# Patient Record
Sex: Female | Born: 1975 | Race: Black or African American | Hispanic: No | Marital: Single | Smoking: Current some day smoker
Health system: Southern US, Community
[De-identification: ages and names within clinical notes are randomized; demographics above are authoritative.]

## PROBLEM LIST (undated history)

## (undated) DIAGNOSIS — F209 Schizophrenia, unspecified: Secondary | ICD-10-CM

## (undated) DIAGNOSIS — D649 Anemia, unspecified: Secondary | ICD-10-CM

## (undated) DIAGNOSIS — F319 Bipolar disorder, unspecified: Secondary | ICD-10-CM

## (undated) DIAGNOSIS — I10 Essential (primary) hypertension: Secondary | ICD-10-CM

---

## 2006-12-08 ENCOUNTER — Ambulatory Visit: Payer: Self-pay | Admitting: Family Medicine

## 2007-03-01 ENCOUNTER — Observation Stay: Payer: Self-pay | Admitting: Unknown Physician Specialty

## 2007-05-25 ENCOUNTER — Observation Stay: Payer: Self-pay

## 2007-05-26 ENCOUNTER — Observation Stay: Payer: Self-pay

## 2007-06-08 ENCOUNTER — Inpatient Hospital Stay: Payer: Self-pay | Admitting: Unknown Physician Specialty

## 2007-10-25 ENCOUNTER — Emergency Department: Payer: Self-pay | Admitting: Emergency Medicine

## 2007-11-24 ENCOUNTER — Encounter: Payer: Self-pay | Admitting: Oral Surgery

## 2007-12-17 ENCOUNTER — Encounter: Payer: Self-pay | Admitting: Oral Surgery

## 2008-03-08 IMAGING — US US OB US >=[ID] SNGL FETUS
1 series · 17 of 28 positions shown · non-contrast
Comparison: none

REASON FOR EXAM: vaginal bleeding, back pain
COMMENTS:

[Series 1: us ob us >=(id) sngl fetus · 17 of 60 slices shown]
[im 1/60]
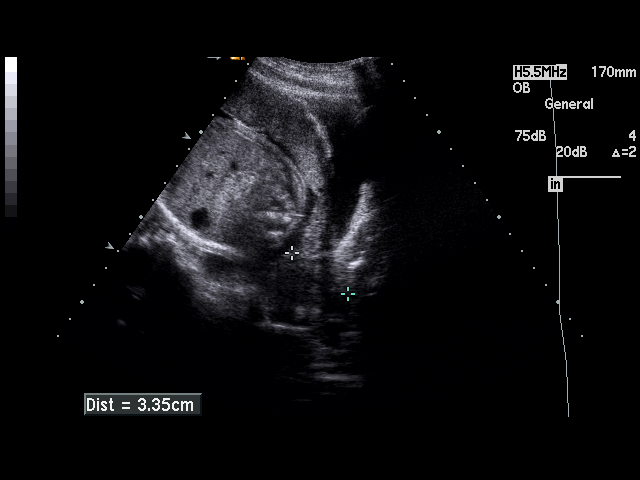
[im 5/60]
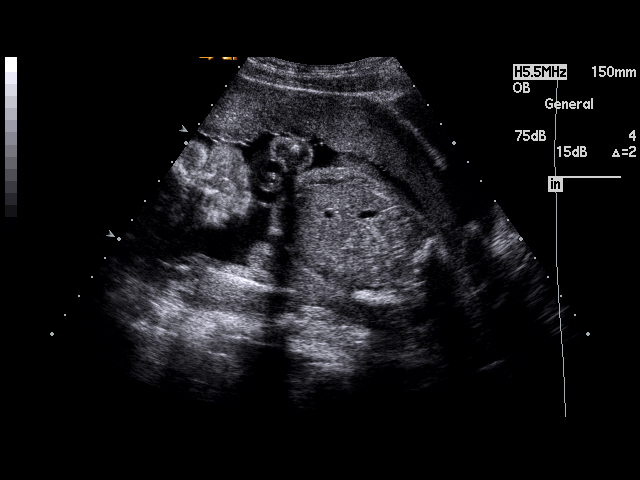
[im 9/60]
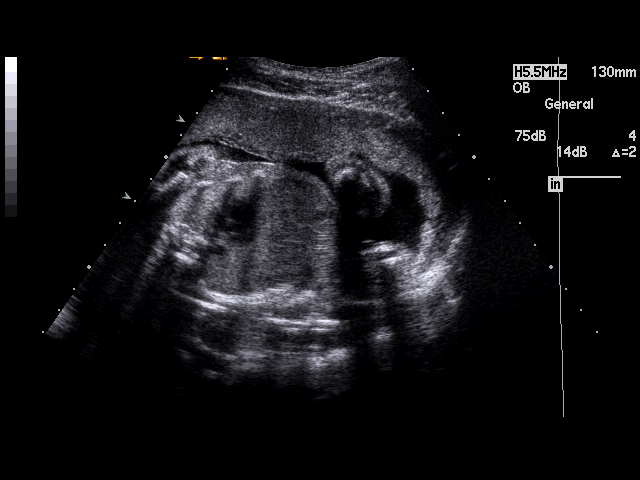
[im 11/60]
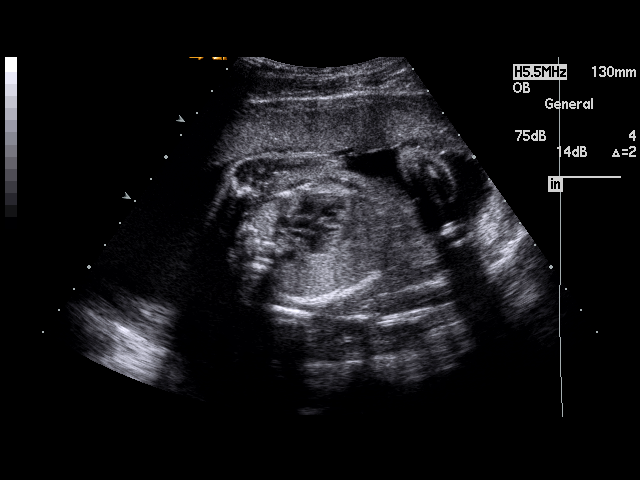
[im 16/60]
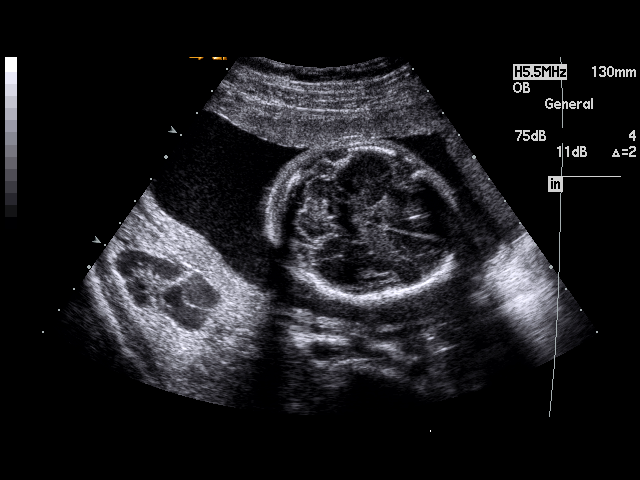
[im 20/60]
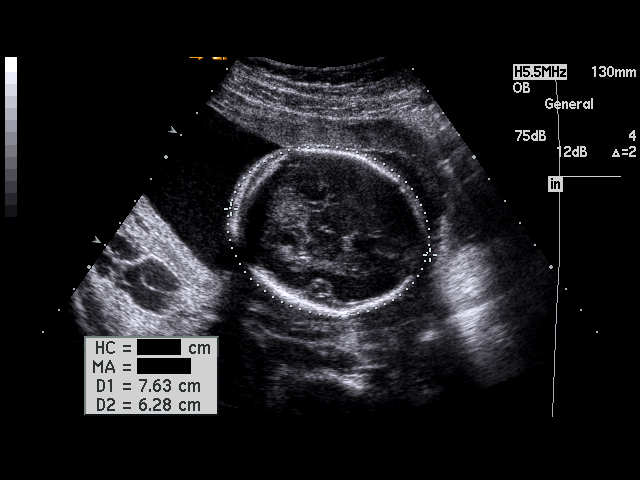
[im 22/60]
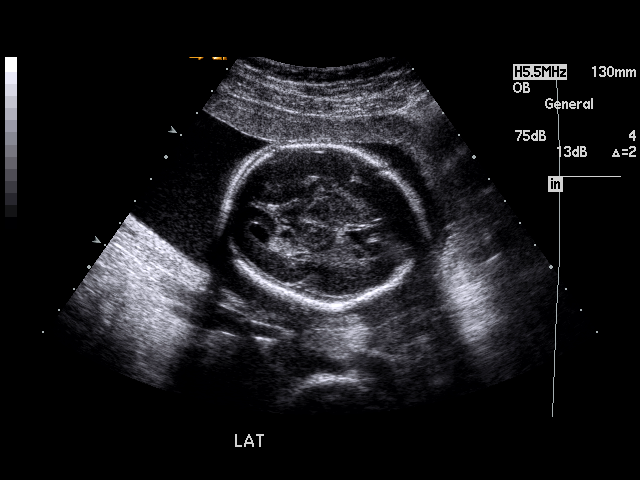
[im 27/60]
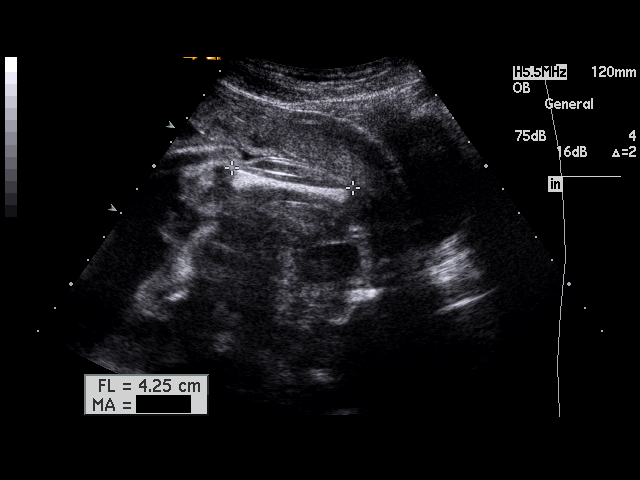
[im 31/60]
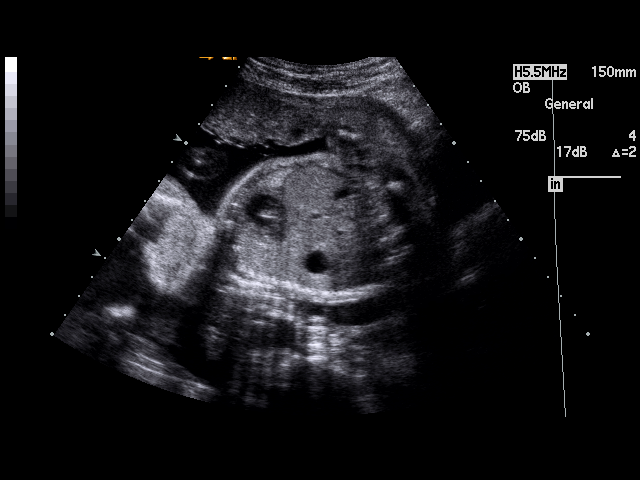
[im 33/60]
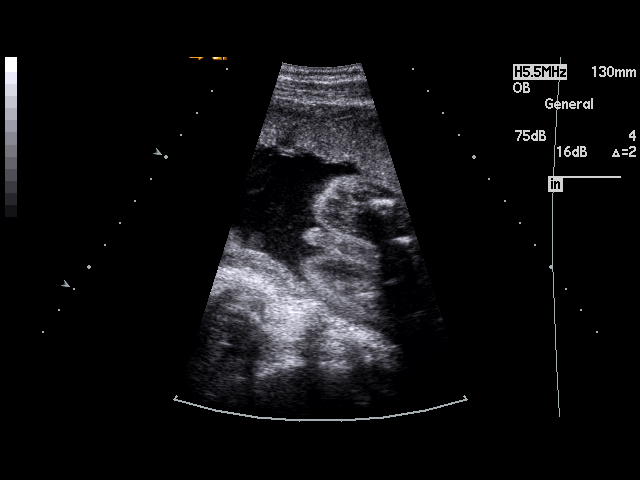
[im 38/60]
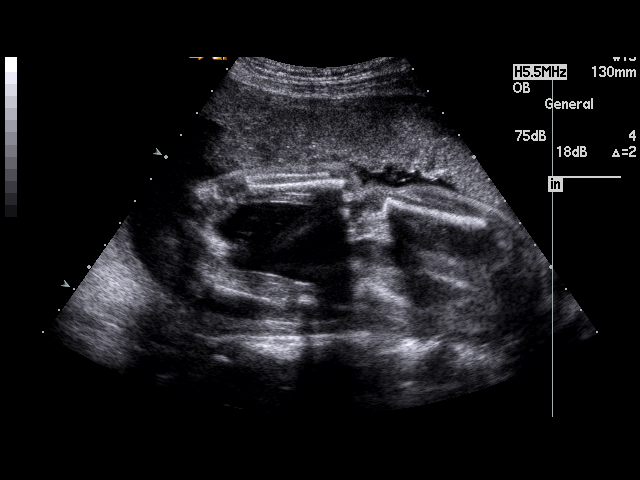
[im 40/60]
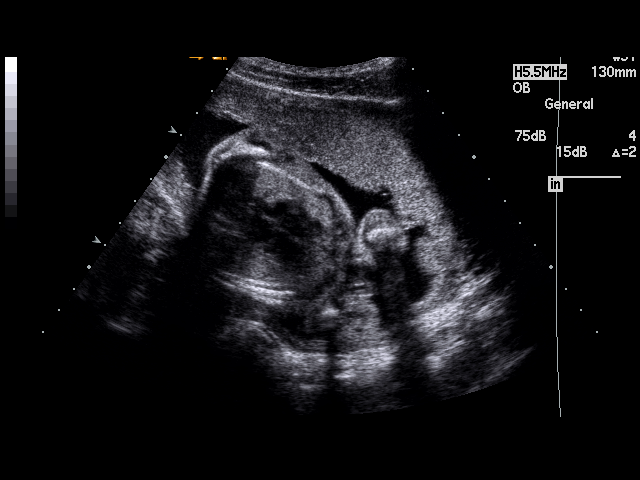
[im 44/60]
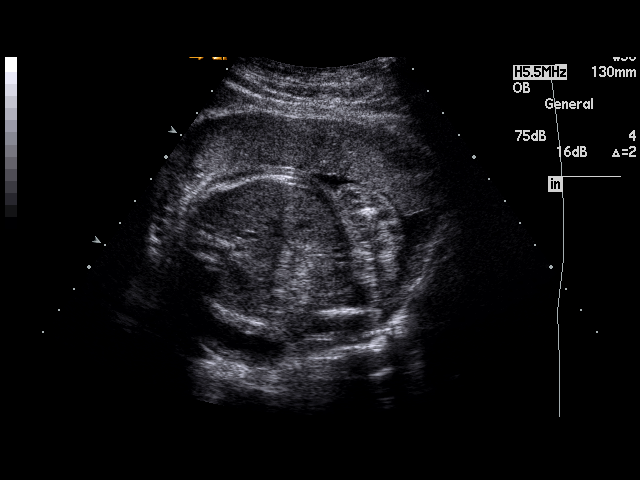
[im 49/60]
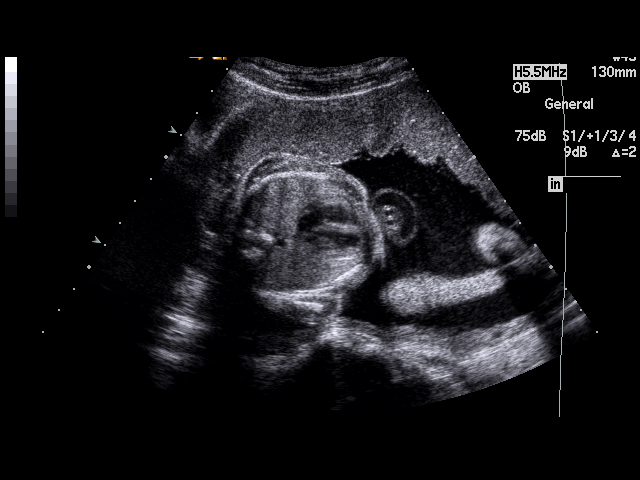
[im 51/60]
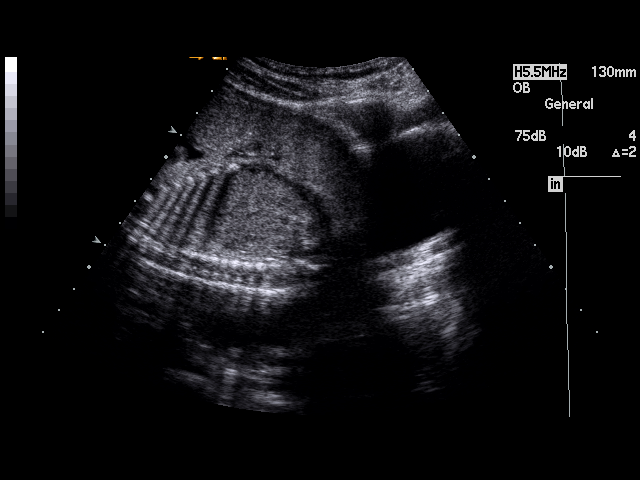
[im 55/60]
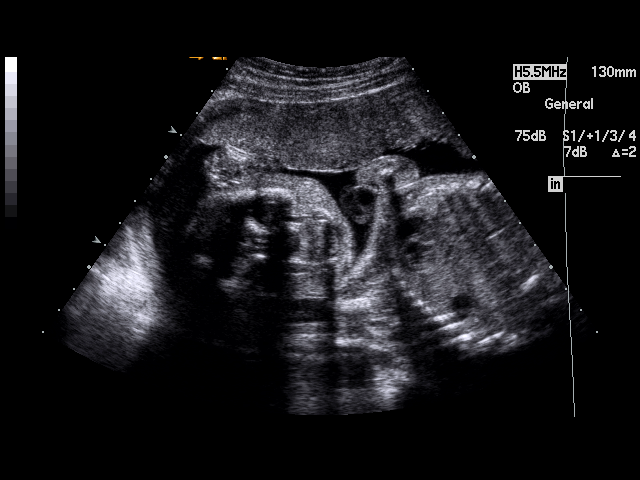
[im 60/60]
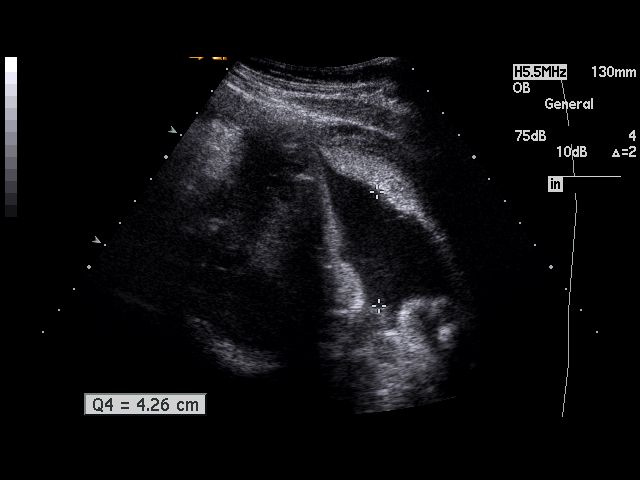

[17 of 28 positions shown; findings below may reference images not displayed]

PROCEDURE:     US  - US OB GREATER/OR EQUAL TO EV1OY  - March 01, 2007  [DATE]

RESULT:     There is a living intrauterine gestation.  Presentation
currently is breech. The placenta is anterior and terminates approximately
3.4 cm superior to the cervix. Fetal cardiac activity was monitored at 138
beats per minute. The fetal heart, stomach and urinary bladder are
visualized. No hydronephrosis or hydrocephalus is seen. Amniotic fluid
volume appears normal. Fetal measurements are as follows:

BPD: 5.84 cm (23 weeks-9 days)
HC: 21.97 cm (24 weeks-7 days)
AC: 18.6 cm (23 weeks-5 days)
FL: 4.32 cm (24 weeks-3 day)
EFW = 646 grams.  Average ultrasound age is 23 weeks-0 days.  Ultrasound EDD
is June 23, 2007.  There has been appropriate interval growth since the
prior exam of December 08, 2006.
IMPRESSION: 1)Please see above.

## 2010-10-28 ENCOUNTER — Emergency Department: Payer: Self-pay | Admitting: Emergency Medicine

## 2010-11-19 ENCOUNTER — Encounter: Payer: Self-pay | Admitting: Maternal & Fetal Medicine

## 2011-02-05 ENCOUNTER — Observation Stay: Payer: Self-pay | Admitting: Obstetrics & Gynecology

## 2011-04-01 ENCOUNTER — Encounter: Payer: Self-pay | Admitting: Obstetrics and Gynecology

## 2011-04-08 ENCOUNTER — Encounter: Payer: Self-pay | Admitting: Maternal & Fetal Medicine

## 2011-04-08 ENCOUNTER — Inpatient Hospital Stay: Payer: Self-pay

## 2011-04-15 ENCOUNTER — Encounter: Payer: Self-pay | Admitting: Maternal and Fetal Medicine

## 2011-04-15 ENCOUNTER — Observation Stay: Payer: Self-pay | Admitting: Advanced Practice Midwife

## 2011-04-19 ENCOUNTER — Encounter: Payer: Self-pay | Admitting: Obstetrics and Gynecology

## 2011-04-22 ENCOUNTER — Encounter: Payer: Self-pay | Admitting: Maternal & Fetal Medicine

## 2011-04-29 ENCOUNTER — Encounter: Payer: Self-pay | Admitting: Obstetrics and Gynecology

## 2011-04-29 ENCOUNTER — Observation Stay: Payer: Self-pay

## 2011-05-02 ENCOUNTER — Inpatient Hospital Stay: Payer: Self-pay

## 2011-05-04 LAB — PATHOLOGY REPORT

## 2011-11-05 IMAGING — US US OB < 14 WEEKS
1 series · 17 of 28 positions shown · non-contrast
Comparison: none

REASON FOR EXAM: cramping and bleeding
COMMENTS:

[Series 1: us ob < 14 weeks · 17 of 48 slices shown]
[im 1/48]
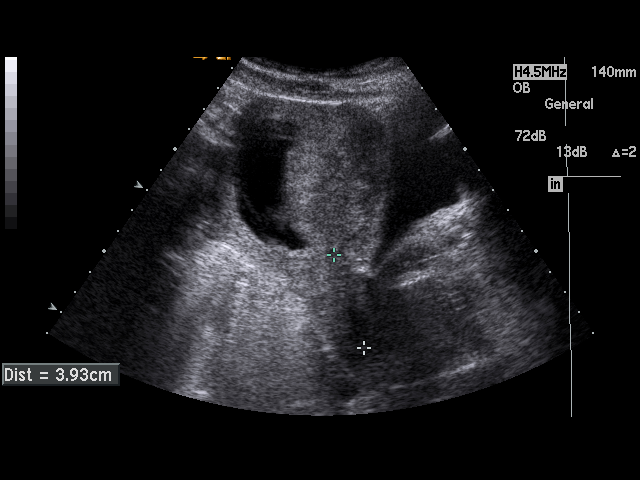
[im 4/48]
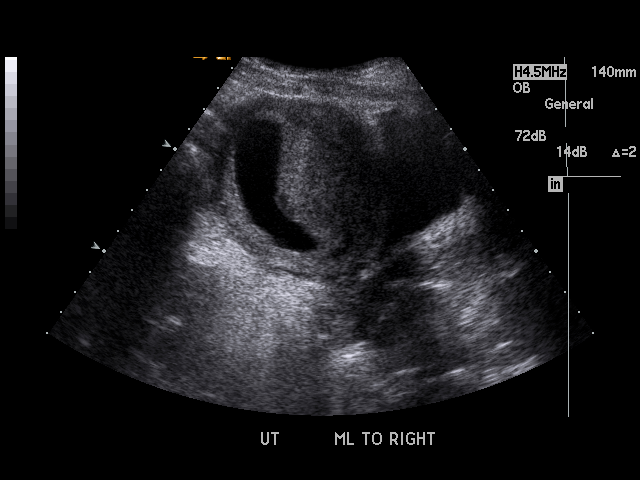
[im 7/48]
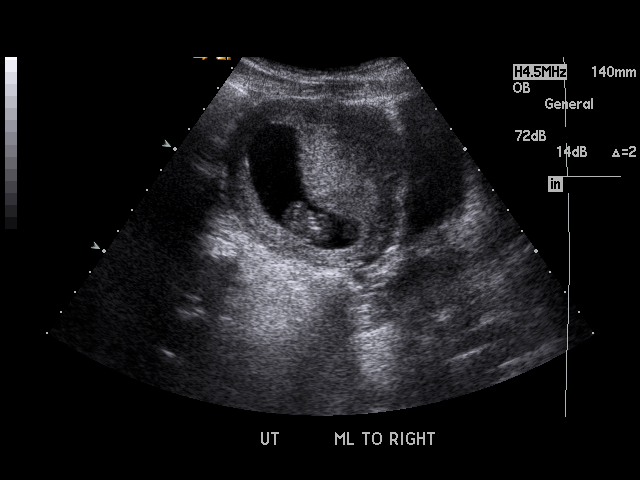
[im 9/48]
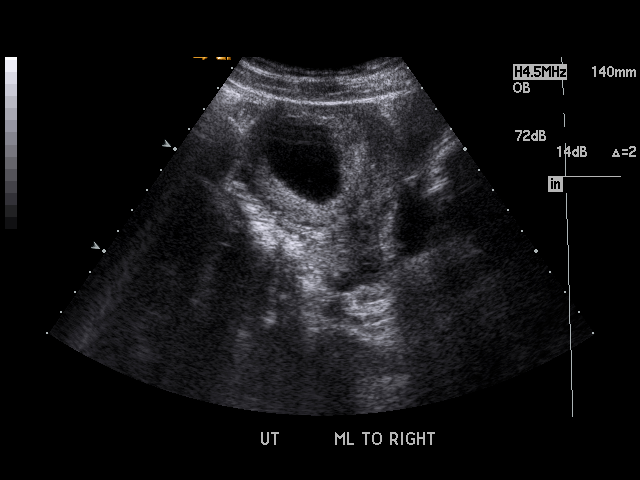
[im 13/48]
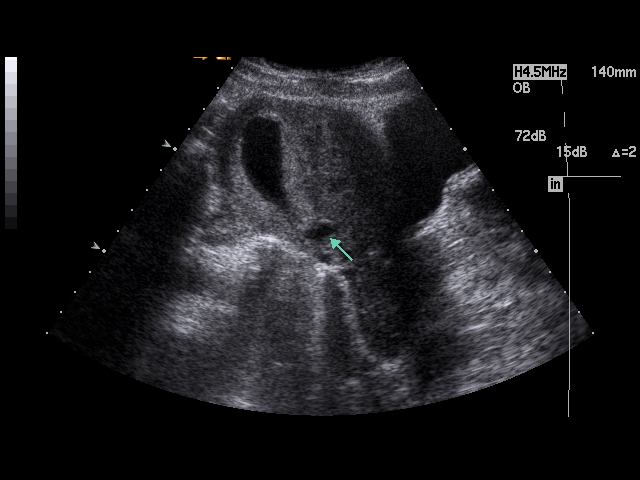
[im 16/48]
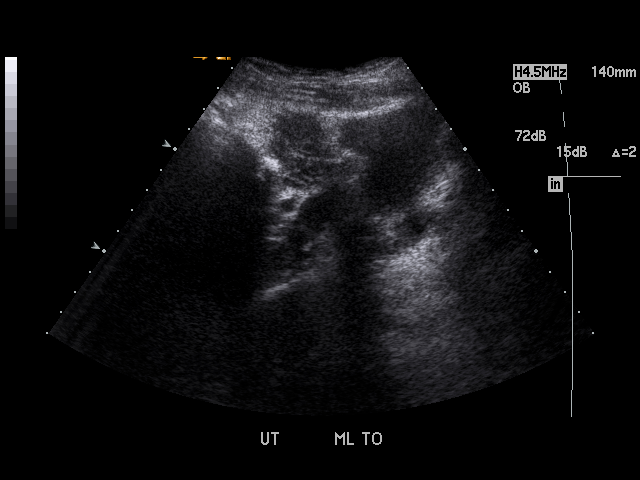
[im 18/48]
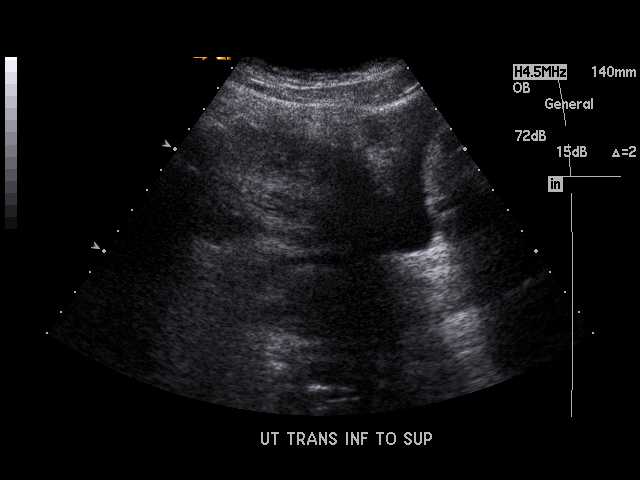
[im 21/48]
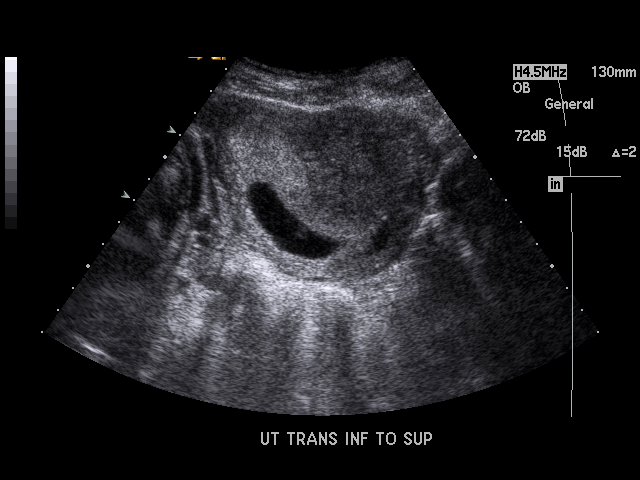
[im 25/48]
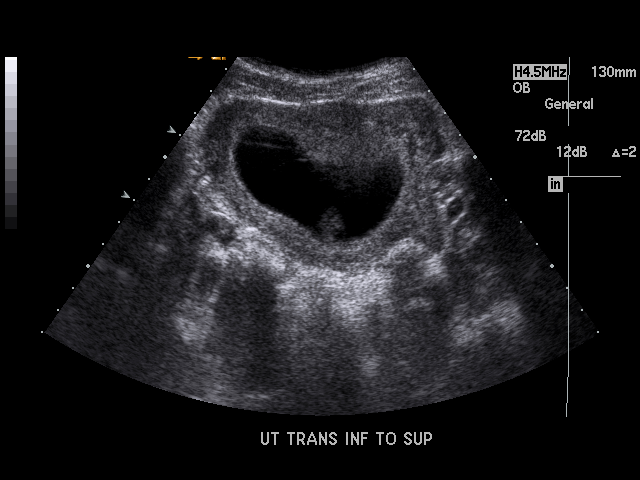
[im 27/48]
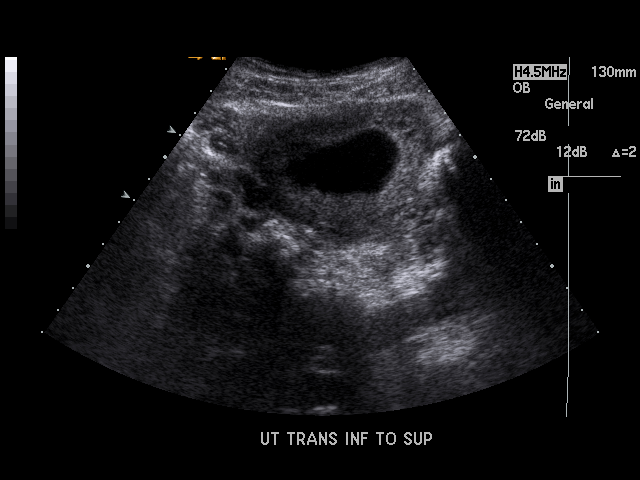
[im 30/48]
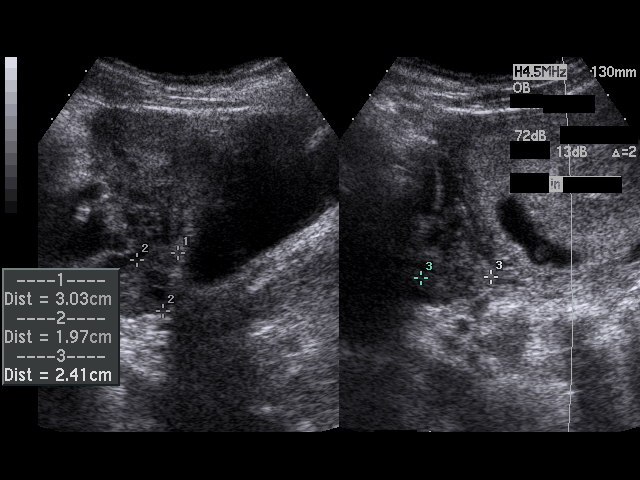
[im 32/48]
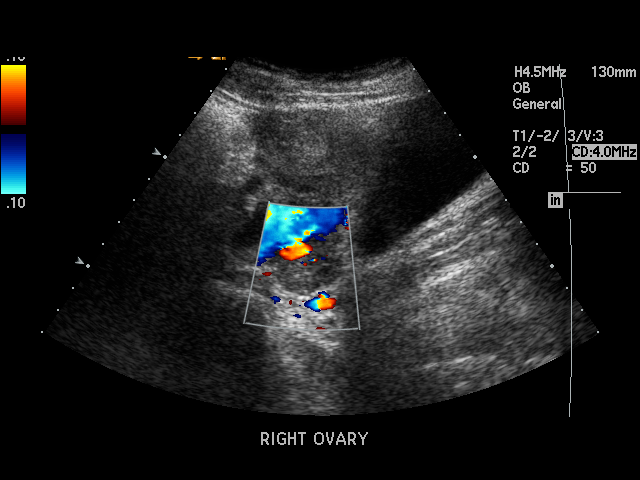
[im 35/48]
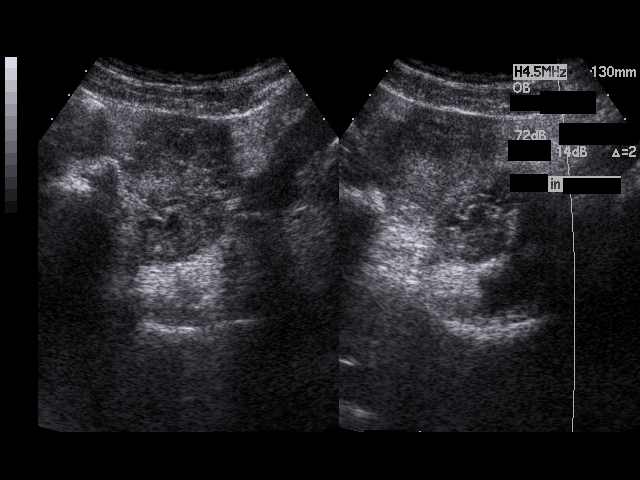
[im 39/48]
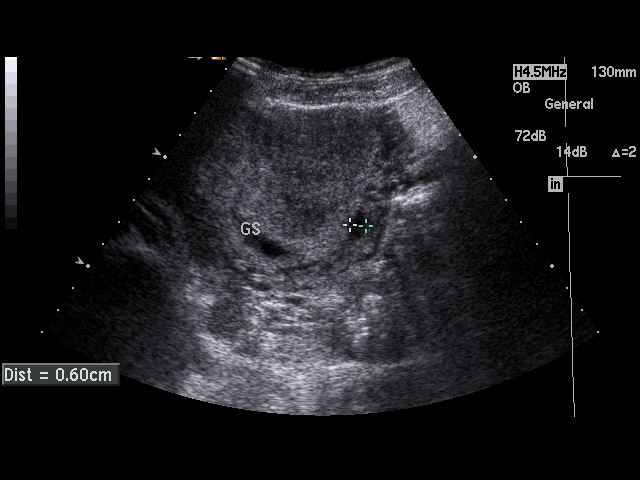
[im 41/48]
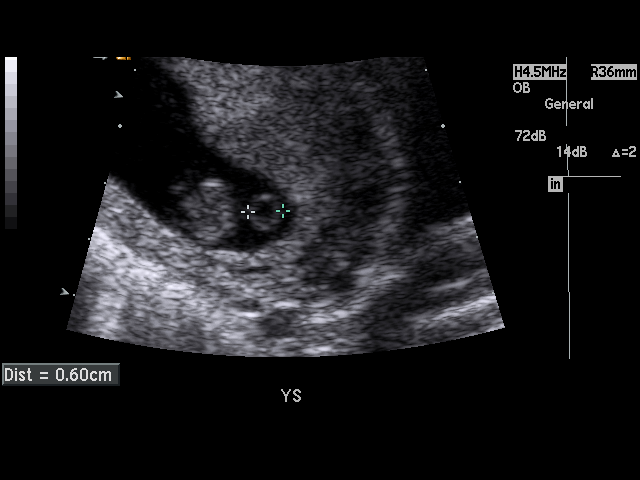
[im 44/48]
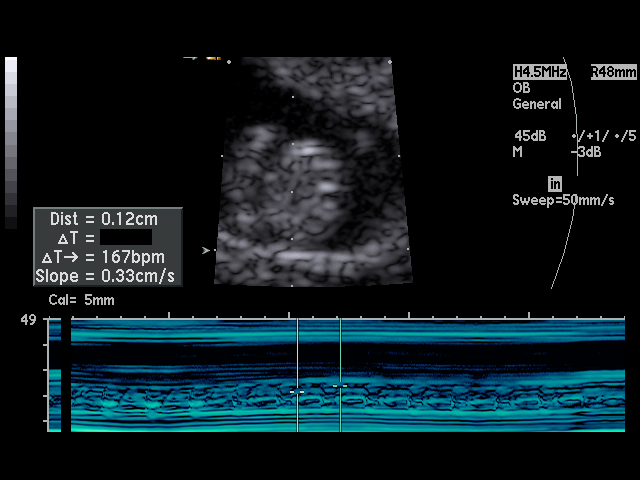
[im 48/48]
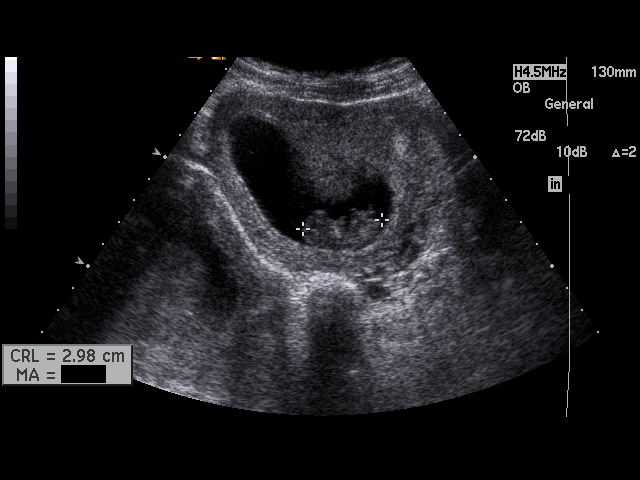

[17 of 28 positions shown; findings below may reference images not displayed]

PROCEDURE:     US  - US OB LESS THAN 14 WEEKS  - October 28, 2010  [DATE]

RESULT:     There is a gravid uterus present. The crown-rump length measures
30.1 mm corresponding to a 9 week 6 day gestation. There is a yolk sac
visible. Fetal cardiac activity with a rate of 167 beats per minute is
demonstrated. A tiny subchorionic bleed to the left of midline inferiorly is
seen. It measures 1.6 x 0.6 cm.

The maternal ovaries are normal in echotexture. The right ovary measures 3 x
2 x 2.4 cm. The left ovary measures 3.2 x 2 x 2.5 cm.
IMPRESSION: There is a viable IUP with estimated gestational age of 9
weeks 6 days. The estimated date of confinement is 27 May, 2011. There is a
tiny subchorionic hemorrhage. No free fluid is demonstrated in the
cul-de-sac or adnexal regions.

## 2013-07-30 ENCOUNTER — Emergency Department: Payer: Self-pay | Admitting: Emergency Medicine

## 2014-04-25 ENCOUNTER — Observation Stay: Payer: Self-pay | Admitting: Obstetrics and Gynecology

## 2014-04-25 LAB — COMPREHENSIVE METABOLIC PANEL
AST: 22 U/L (ref 15–37)
Albumin: 3.9 g/dL (ref 3.4–5.0)
Alkaline Phosphatase: 60 U/L
Anion Gap: 4 — ABNORMAL LOW (ref 7–16)
BUN: 9 mg/dL (ref 7–18)
Bilirubin,Total: 0.2 mg/dL (ref 0.2–1.0)
CO2: 24 mmol/L (ref 21–32)
Calcium, Total: 8.6 mg/dL (ref 8.5–10.1)
Chloride: 108 mmol/L — ABNORMAL HIGH (ref 98–107)
Creatinine: 1.13 mg/dL (ref 0.60–1.30)
Glucose: 88 mg/dL (ref 65–99)
OSMOLALITY: 270 (ref 275–301)
Potassium: 3.6 mmol/L (ref 3.5–5.1)
SGPT (ALT): 14 U/L (ref 12–78)
Sodium: 136 mmol/L (ref 136–145)
Total Protein: 7.7 g/dL (ref 6.4–8.2)

## 2014-04-25 LAB — CBC
HCT: 23.2 % — ABNORMAL LOW (ref 35.0–47.0)
HGB: 6.6 g/dL — ABNORMAL LOW (ref 12.0–16.0)
MCH: 18 pg — ABNORMAL LOW (ref 26.0–34.0)
MCHC: 28.5 g/dL — ABNORMAL LOW (ref 32.0–36.0)
MCV: 63 fL — ABNORMAL LOW (ref 80–100)
Platelet: 334 10*3/uL (ref 150–440)
RBC: 3.67 10*6/uL — ABNORMAL LOW (ref 3.80–5.20)
RDW: 18.5 % — ABNORMAL HIGH (ref 11.5–14.5)
WBC: 4.9 10*3/uL (ref 3.6–11.0)

## 2014-04-25 LAB — APTT: Activated PTT: 33 secs (ref 23.6–35.9)

## 2014-04-25 LAB — PROTIME-INR
INR: 1
Prothrombin Time: 13 secs (ref 11.5–14.7)

## 2014-04-25 LAB — HCG, QUANTITATIVE, PREGNANCY

## 2014-04-25 LAB — WET PREP, GENITAL

## 2014-04-25 LAB — TSH: THYROID STIMULATING HORM: 0.977 u[IU]/mL

## 2014-04-25 LAB — GC/CHLAMYDIA PROBE AMP

## 2014-04-25 LAB — LIPASE, BLOOD: Lipase: 140 U/L (ref 73–393)

## 2014-04-26 LAB — CBC WITH DIFFERENTIAL/PLATELET
Basophil #: 0 10*3/uL (ref 0.0–0.1)
Basophil %: 0.7 %
Eosinophil #: 0.3 10*3/uL (ref 0.0–0.7)
Eosinophil %: 4.9 %
HCT: 24.1 % — ABNORMAL LOW (ref 35.0–47.0)
HGB: 7.2 g/dL — ABNORMAL LOW (ref 12.0–16.0)
LYMPHS ABS: 2.6 10*3/uL (ref 1.0–3.6)
Lymphocyte %: 40.3 %
MCH: 19.8 pg — ABNORMAL LOW (ref 26.0–34.0)
MCHC: 29.7 g/dL — AB (ref 32.0–36.0)
MCV: 67 fL — AB (ref 80–100)
Monocyte #: 0.6 x10 3/mm (ref 0.2–0.9)
Monocyte %: 8.8 %
NEUTROS PCT: 45.3 %
Neutrophil #: 2.9 10*3/uL (ref 1.4–6.5)
Platelet: 293 10*3/uL (ref 150–440)
RBC: 3.61 10*6/uL — AB (ref 3.80–5.20)
RDW: 21.5 % — ABNORMAL HIGH (ref 11.5–14.5)
WBC: 6.4 10*3/uL (ref 3.6–11.0)

## 2015-03-08 NOTE — Consult Note (Signed)
Brief Consult Note: Diagnosis: menorrhagia to anemia.   Recommend further assessment or treatment.   Orders entered.   Discussed with Attending MD.  Electronic Signatures: Lorrene ReidStaebler, Deryl Giroux M (MD)  (Signed 11-Jun-15 17:28)  Authored: Brief Consult Note   Last Updated: 11-Jun-15 17:28 by Lorrene ReidStaebler, Dandra Shambaugh M (MD)

## 2015-08-02 ENCOUNTER — Encounter: Payer: Self-pay | Admitting: Emergency Medicine

## 2015-08-02 ENCOUNTER — Emergency Department
Admission: EM | Admit: 2015-08-02 | Discharge: 2015-08-04 | Disposition: A | Discharge status: Other Acute Inpatient Hospital | Payer: Medicaid Other | Attending: Emergency Medicine | Admitting: Emergency Medicine

## 2015-08-02 DIAGNOSIS — Z3202 Encounter for pregnancy test, result negative: Secondary | ICD-10-CM | POA: Diagnosis not present

## 2015-08-02 DIAGNOSIS — I10 Essential (primary) hypertension: Secondary | ICD-10-CM | POA: Diagnosis not present

## 2015-08-02 DIAGNOSIS — F29 Unspecified psychosis not due to a substance or known physiological condition: Secondary | ICD-10-CM | POA: Diagnosis not present

## 2015-08-02 DIAGNOSIS — Z720 Tobacco use: Secondary | ICD-10-CM | POA: Diagnosis not present

## 2015-08-02 DIAGNOSIS — Z008 Encounter for other general examination: Secondary | ICD-10-CM | POA: Diagnosis present

## 2015-08-02 DIAGNOSIS — R440 Auditory hallucinations: Secondary | ICD-10-CM

## 2015-08-02 DIAGNOSIS — F1410 Cocaine abuse, uncomplicated: Secondary | ICD-10-CM

## 2015-08-02 DIAGNOSIS — N946 Dysmenorrhea, unspecified: Secondary | ICD-10-CM

## 2015-08-02 DIAGNOSIS — F14151 Cocaine abuse with cocaine-induced psychotic disorder with hallucinations: Secondary | ICD-10-CM | POA: Diagnosis not present

## 2015-08-02 HISTORY — DX: Essential (primary) hypertension: I10

## 2015-08-02 LAB — URINE DRUG SCREEN, QUALITATIVE (ARMC ONLY)
AMPHETAMINES, UR SCREEN: NOT DETECTED
Barbiturates, Ur Screen: NOT DETECTED
Benzodiazepine, Ur Scrn: NOT DETECTED
COCAINE METABOLITE, UR ~~LOC~~: POSITIVE — AB
Cannabinoid 50 Ng, Ur ~~LOC~~: POSITIVE — AB
MDMA (Ecstasy)Ur Screen: NOT DETECTED
METHADONE SCREEN, URINE: NOT DETECTED
OPIATE, UR SCREEN: NOT DETECTED
Phencyclidine (PCP) Ur S: NOT DETECTED
Tricyclic, Ur Screen: NOT DETECTED

## 2015-08-02 LAB — COMPREHENSIVE METABOLIC PANEL
ALBUMIN: 4.1 g/dL (ref 3.5–5.0)
ALK PHOS: 55 U/L (ref 38–126)
ALT: 8 U/L — ABNORMAL LOW (ref 14–54)
ANION GAP: 5 (ref 5–15)
AST: 18 U/L (ref 15–41)
BUN: 8 mg/dL (ref 6–20)
CALCIUM: 9.3 mg/dL (ref 8.9–10.3)
CO2: 25 mmol/L (ref 22–32)
Chloride: 109 mmol/L (ref 101–111)
Creatinine, Ser: 0.97 mg/dL (ref 0.44–1.00)
GFR calc Af Amer: >60 mL/min (ref >60)
GFR calc non Af Amer: >60 mL/min (ref >60)
GLUCOSE: 96 mg/dL (ref 65–99)
POTASSIUM: 4 mmol/L (ref 3.5–5.1)
SODIUM: 139 mmol/L (ref 135–145)
Total Bilirubin: 0.2 mg/dL — ABNORMAL LOW (ref 0.3–1.2)
Total Protein: 7.7 g/dL (ref 6.5–8.1)

## 2015-08-02 LAB — CBC
HEMATOCRIT: 26.9 % — AB (ref 35.0–47.0)
HEMOGLOBIN: 7.6 g/dL — AB (ref 12.0–16.0)
MCH: 17.1 pg — ABNORMAL LOW (ref 26.0–34.0)
MCHC: 28.1 g/dL — ABNORMAL LOW (ref 32.0–36.0)
MCV: 60.9 fL — ABNORMAL LOW (ref 80.0–100.0)
Platelets: 323 10*3/uL (ref 150–440)
RBC: 4.42 MIL/uL (ref 3.80–5.20)
RDW: 19.1 % — ABNORMAL HIGH (ref 11.5–14.5)
WBC: 5.1 10*3/uL (ref 3.6–11.0)

## 2015-08-02 LAB — ACETAMINOPHEN LEVEL

## 2015-08-02 LAB — SALICYLATE LEVEL: Salicylate Lvl: <4 mg/dL (ref 2.8–30.0)

## 2015-08-02 LAB — ETHANOL: Alcohol, Ethyl (B): <5 mg/dL (ref <5)

## 2015-08-02 MED ORDER — ACETAMINOPHEN 325 MG PO TABS
650.0000 mg | ORAL_TABLET | Freq: Once | ORAL | Status: AC
  Administered 2015-08-02: 650 mg via ORAL
  Filled 2015-08-02: qty 2

## 2015-08-02 MED ORDER — RISPERIDONE 1 MG PO TBDP
1.0000 mg | ORAL_TABLET | Freq: Two times a day (BID) | ORAL | Status: DC
  Administered 2015-08-02 – 2015-08-04 (×4): 1 mg via ORAL
  Filled 2015-08-02 (×6): qty 1

## 2015-08-02 MED ORDER — HALOPERIDOL 5 MG PO TABS
5.0000 mg | ORAL_TABLET | Freq: Once | ORAL | Status: AC
  Administered 2015-08-02: 5 mg via ORAL
  Filled 2015-08-02: qty 1

## 2015-08-02 NOTE — BH Assessment (Signed)
Assessment Note  Annette Garrison is an 39 y.o. female. Patient was brought into by family because of bizarre behaviors, hallucinations, and homicidal ideations toward her family.  Per note from family, "Annette Garrison mother of Annette Garrison, hearing voices telling her to hurt others, to kill her children(20yo, 16yo, 8yo, 4yo). Illegal drugs, depression-out of hand. Patient has a 20yo daughter who has been watching and caring for her mother. [name] sleeping a lot, 4-6 months patient's behaviors has gottent really bad. Patient needs help-patient out of hand. Patient eldest daughter 84 yo Annette Garrison [phone] knows best of what's going on" Patient denies suicidal/homicial ideations and self-injurious behaviors.  Patient reports over the past 2 weeks experiencing hallucinations.  She reports her exes new girlfriend put a "Root" on me.  She reports hearing demons in her head but after going to a church and being prayed from feeling better. She reports although she felt better the demon's voices went from being heard in her head to now in her stomach.  The voices continue to ask her about someone named "Annette Garrison" whether she attempted to call the police on this person.  Per nurse's note, the patient reported the voices are telling her to kill herself".   Patient denies past mental health or Substance abuse treatment.  Patient denies legal current/past.  Patient reports substances used are marijuana, 1 or more a week, started age 25, last used 2 days ago.  Patient reports snorting cocaine for the past 2 years and unknown amount.    This Clinical research associate consulted with Dr. Guss Bunde it is recommended to refer for inpatient treatment for stabilization and safety.    Axis I: Psychotic Disorder NOS Axis II: Deferred Axis III:  Past Medical History  Diagnosis Date  . Hypertension    Axis IV: economic problems, housing problems, other psychosocial or environmental problems, problems related to social environment, problems with  access to health care services and problems with primary support group Axis V: 21-30 behavior considerably influenced by delusions or hallucinations OR serious impairment in judgment, communication OR inability to function in almost all areas  Past Medical History:  Past Medical History  Diagnosis Date  . Hypertension     Past Surgical History  Procedure Laterality Date  . Cesarean section      Family History: No family history on file.  Social History:  reports that she has been smoking Cigarettes.  She has been smoking about 2.00 packs per day. She does not have any smokeless tobacco history on file. She reports that she uses illicit drugs (Marijuana and Cocaine). She reports that she does not drink alcohol.  Additional Social History:  Alcohol / Drug Use Pain Medications: See MARs Prescriptions: See MARs Over the Counter: See MARs History of alcohol / drug use?: Yes Longest period of sobriety (when/how long): 2years Substance #1 Name of Substance 1: Marijuana 1 - Age of First Use: 18 1 - Amount (size/oz): half of a blunt 1 - Frequency: daily 1 - Duration: 20years 1 - Last Use / Amount: 07/01/2015 Substance #2 Name of Substance 2: cocaine 2 - Age of First Use: 36 2 - Amount (size/oz): varies 2 - Frequency: varies 2 - Duration: on/off 64yrs 2 - Last Use / Amount: 07/01/2015  CIWA: CIWA-Ar BP: (!) 154/108 mmHg Pulse Rate: (!) 103 Nausea and Vomiting: no nausea and no vomiting Tactile Disturbances: none Tremor: no tremor Auditory Disturbances: not present Paroxysmal Sweats: no sweat visible Visual Disturbances: not present Anxiety: no anxiety, at ease  Headache, Fullness in Head: none present Agitation: normal activity Orientation and Clouding of Sensorium: oriented and can do serial additions CIWA-Ar Total: 0 COWS: Clinical Opiate Withdrawal Scale (COWS) Resting Pulse Rate: Pulse Rate 80 or below Sweating: No report of chills or flushing Restlessness: Able to sit  still Pupil Size: Pupils pinned or normal size for room light Bone or Joint Aches: Not present Runny Nose or Tearing: Not present GI Upset: No GI symptoms Tremor: No tremor Yawning: No yawning Anxiety or Irritability: None Gooseflesh Skin: Skin is smooth COWS Total Score: 0  Allergies: No Known Allergies  Home Medications:  (Not in a hospital admission)  OB/GYN Status:  No LMP recorded (lmp unknown).  General Assessment Data Location of Assessment: [facility] ED TTS Assessment: In system Is this a Tele or Face-to-Face Assessment?: Face-to-Face Is this an Initial Assessment or a Re-assessment for this encounter?: Initial Assessment Marital status: Separated Is patient pregnant?: No Pregnancy Status: No Living Arrangements: Other relatives (Aunt) Can pt return to current living arrangement?: Yes Admission Status: Involuntary Is patient capable of signing voluntary admission?: Yes Referral Source: Self/Family/Friend  Medical Screening Exam [facility] Walk-in ONLY) Medical Exam completed: Yes  Crisis Care Plan Living Arrangements: Other relatives Midwife) Name of Psychiatrist: none  Name of Therapist: none  Education Status Is patient currently in school?: No Current Grade: na Highest grade of school patient has completed: na Name of school: na Contact person: na  Risk to self with the past 6 months Suicidal Ideation: No-Not Currently/Within Last 6 Months Has patient been a risk to self within the past 6 months prior to admission? : No Suicidal Intent: No-Not Currently/Within Last 6 Months Has patient had any suicidal intent within the past 6 months prior to admission? : No Is patient at risk for suicide?: No Suicidal Plan?: No-Not Currently/Within Last 6 Months Has patient had any suicidal plan within the past 6 months prior to admission? : No Access to Means: No What has been your use of drugs/alcohol within the last 12 months?: marijuana, cocaine Previous Attempts/Gestures:  No How many times?: 0 Intentional Self Injurious Behavior: None Family Suicide History: Unknown Recent stressful life event(s): Turmoil (Comment) Persecutory voices/beliefs?: No Depression: No Substance abuse history and/or treatment for substance abuse?: Yes  Risk to Others within the past 6 months Homicidal Ideation: No (Per daughter the patient has hearing voices to kill kids) Does patient have any lifetime risk of violence toward others beyond the six months prior to admission? : No Thoughts of Harm to Others:  (Per family she has voices telling to kill childrend) Current Homicidal Plan: No Identified Victim: her 4 children History of harm to others?: No Assessment of Violence: None Noted Criminal Charges Pending?: No Does patient have a court date: No Is patient on probation?: No  Psychosis Hallucinations: Auditory, Visual, With command Delusions: Unspecified  Mental Status Report Appearance/Hygiene: In hospital gown Eye Contact: Fair Motor Activity: Freedom of movement Speech: Logical/coherent Level of Consciousness: Alert Mood: Ambivalent Affect: Inconsistent with thought content Anxiety Level: Minimal Thought Processes: Relevant Judgement: Impaired Orientation: Person, Place, Time, Situation Obsessive Compulsive Thoughts/Behaviors: None  Cognitive Functioning Concentration: Fair Memory: Recent Intact, Remote Intact IQ: Average Insight: Poor Impulse Control: Fair Appetite: Fair Weight Loss: 0 Weight Gain: 0 Sleep: No Change Vegetative Symptoms: None  ADLScreening [facility] Assessment Services) Patient's cognitive ability adequate to safely complete daily activities?: Yes Patient able to express need for assistance with ADLs?: Yes Independently performs ADLs?: Yes (appropriate for developmental age)  Prior  Inpatient Therapy Prior Inpatient Therapy: No  Prior Outpatient Therapy Prior Outpatient Therapy: No Does patient have an ACCT team?: No Does patient  have Intensive In-House Services?  : No Does patient have [facility] services? : No Does patient have P4CC services?: No  ADL Screening (condition at time of admission) Patient's cognitive ability adequate to safely complete daily activities?: Yes Patient able to express need for assistance with ADLs?: Yes Independently performs ADLs?: Yes (appropriate for developmental age)       Abuse/Neglect Assessment (Assessment to be complete while patient is alone) Physical Abuse: Denies Verbal Abuse: Denies Sexual Abuse: Denies Exploitation of patient/patient's resources: Denies Self-Neglect: Denies Values / Beliefs Cultural Requests During Hospitalization: None Spiritual Requests During Hospitalization: Religious literature, Other (comment) (chaplin request) Consults Spiritual Care Consult Needed: Yes (Comment) Hydrologist) Social Work Consult Needed: No Merchant navy officer (For Healthcare) Does patient have an advance directive?: No Would patient like information on creating an advanced directive?: No - patient declined information    Additional Information 1:1 In Past 12 Months?: No CIRT Risk: No Elopement Risk: No Does patient have medical clearance?: Yes     Disposition:  Disposition Initial Assessment Completed for this Encounter: Yes Disposition of Patient: Inpatient treatment program Type of inpatient treatment program: Adult  On Site Evaluation by:   Reviewed with Physician:    Maryelizabeth Rowan A 08/02/2015 5:39 PM

## 2015-08-02 NOTE — ED Notes (Signed)
Spoke with the oncall chaplain - pt has been seen 3x previously today by this same chaplain. He states he has to do his rounds since he is the only chaplain oncall during the weekends but he will stop by to see her again before his shift ends.

## 2015-08-02 NOTE — ED Notes (Signed)
BEHAVIORAL HEALTH ROUNDING Patient sleeping: Yes.   Patient alert and oriented: not applicable Behavior appropriate: Yes.  ; If no, describe:  Nutrition and fluids offered: No Toileting and hygiene offered: No Sitter present: yes Law enforcement present: Yes   

## 2015-08-02 NOTE — ED Notes (Signed)
IVC 

## 2015-08-02 NOTE — ED Notes (Signed)

## 2015-08-02 NOTE — ED Notes (Signed)

## 2015-08-02 NOTE — ED Notes (Signed)
BEHAVIORAL HEALTH ROUNDING Patient sleeping: Yes.   Patient alert and oriented: yes Behavior appropriate: Yes.  ; If no, describe:  Nutrition and fluids offered: Yes  Toileting and hygiene offered: Yes  Sitter present: yes Law enforcement present: Yes  

## 2015-08-02 NOTE — ED Notes (Signed)
BEHAVIORAL HEALTH ROUNDING Patient sleeping: No. Patient alert and oriented: yes Behavior appropriate: Yes.  ; If no, describe:  Nutrition and fluids offered: Yes  Toileting and hygiene offered: Yes  Sitter present: yes Law enforcement present: Yes  

## 2015-08-02 NOTE — ED Provider Notes (Signed)
[facility] Emergency Department Provider Note  ____________________________________________  Time seen: 10:30 AM  I have reviewed the triage vital signs and the nursing notes.   HISTORY  Chief Complaint Psychiatric Evaluation  auditory hallucinations     HPI Annette Garrison is a 39 y.o. female who denies a history of prior psychiatric issues who reports that she went to church last week. She became nauseous and began to vomit. She saw demons come out as she vomited. She says other pastors and people around her saw the demons as well as. She is also hearing voices. The voices make reference to a friend of hers. Some of the details that she describes are unclear to me. The voices also speak about a doctor named [name] who is a "root doctor" which she clarifies as saying he is a Information systems manager.  The patient is planning on having an exorcism. She is seeking help to make sure that she is not pregnant beforehand. She feels something moving in her belly.    Past Medical History  Diagnosis Date  . Hypertension     There are no active problems to display for this patient.   Past Surgical History  Procedure Laterality Date  . Cesarean section      No current outpatient prescriptions on file.  Allergies Review of patient's allergies indicates no known allergies.  No family history on file.  Social History Social History  Substance Use Topics  . Smoking status: Current Every Day Smoker -- 2.00 packs/day    Types: Cigarettes  . Smokeless tobacco: None  . Alcohol Use: No    Review of Systems  Constitutional: Negative for fever. ENT: Negative for sore throat. Cardiovascular: Negative for chest pain. Respiratory: Negative for shortness of breath. Gastrointestinal: Negative for abdominal pain, vomiting and diarrhea. Genitourinary: Negative for dysuria. Musculoskeletal: No myalgias or injuries. Skin: Negative for rash. Neurological: Negative for  headaches Psychological: See history of present illness. Notable auditory hallucinations.  10-point ROS otherwise negative.  ____________________________________________   PHYSICAL EXAM:  VITAL SIGNS: ED Triage Vitals  Enc Vitals Group     BP 08/02/15 0905 154/108 mmHg     Pulse Rate 08/02/15 0905 103     Resp 08/02/15 0905 18     Temp 08/02/15 0905 97.7 F (36.5 C)     Temp Source 08/02/15 0905 Oral     SpO2 08/02/15 0905 100 %     Weight 08/02/15 0905 131 lb (59.421 kg)     Height 08/02/15 0905 5\' 3"  (1.6 m)     Head Cir --      Peak Flow --      Pain Score 08/02/15 0907 6     Pain Loc --      Pain Edu? --      Excl. in GC? --     Constitutional:  Alert and oriented. Well-nourished well-developed. No acute distress, but mild anxiety notable. ENT   Head: Normocephalic and atraumatic.   Nose: No congestion/rhinnorhea.   Mouth/Throat: Mucous membranes are moist. Cardiovascular: Normal rate, regular rhythm, no murmur noted Respiratory:  Normal respiratory effort, no tachypnea.    Breath sounds are clear and equal bilaterally.  Gastrointestinal: Soft and nontender. No distention.  Back: No muscle spasm, no tenderness, no CVA tenderness. Musculoskeletal: No deformity noted. Nontender with normal range of motion in all extremities.  No noted edema. Neurologic:  Normal speech and language. No gross focal neurologic deficits are appreciated.  Skin:  Skin is warm,  dry. No rash noted. Psychiatric: Alert, communicative. She reports auditory hallucinations and has concerns of being possessed and in need of an exorcism. ____________________________________________    LABS (pertinent positives/negatives)  Labs Reviewed  COMPREHENSIVE METABOLIC PANEL - Abnormal; Notable for the following:    ALT 8 (*)    Total Bilirubin 0.2 (*)    All other components within normal limits  ACETAMINOPHEN LEVEL - Abnormal; Notable for the following:    Acetaminophen (Tylenol), Serum  <10 (*)    All other components within normal limits  CBC - Abnormal; Notable for the following:    Hemoglobin 7.6 (*)    HCT 26.9 (*)    MCV 60.9 (*)    MCH 17.1 (*)    MCHC 28.1 (*)    RDW 19.1 (*)    All other components within normal limits  URINE DRUG SCREEN, QUALITATIVE ([facility] ONLY) - Abnormal; Notable for the following:    Cocaine Metabolite,Ur [place] POSITIVE (*)    Cannabinoid 50 Ng, Ur [place] POSITIVE (*)    All other components within normal limits  ETHANOL  SALICYLATE LEVEL  PREGNANCY, URINE  POC URINE PREG, ED     ____________________________________________   INITIAL IMPRESSION / ASSESSMENT AND PLAN / ED COURSE  Pertinent labs & imaging results that were available during my care of the patient were reviewed by me and considered in my medical decision making (see chart for details).   39 year old female, slight anxiety agitation, with auditory hallucinations and thoughts of being possessed. We will treat her with 5 kg of Haldol and get a psychiatric consultation. She has a slight headache and is asking for medication. We'll treated with Tylenol.  ----------------------------------------- 3:10 PM on 08/02/2015 -----------------------------------------  Patient has been on and cooperative. We have placed her under an involuntary commitment order due to her apparent psychosis.  We will move her to the behavioral health unit pending psychiatric consult.  ____________________________________________   FINAL CLINICAL IMPRESSION(S) / ED DIAGNOSES  Final diagnoses:  Auditory hallucinations  Psychosis, unspecified psychosis type      Darien Ramus, MD 08/02/15 [phone]

## 2015-08-02 NOTE — ED Notes (Addendum)
BEHAVIORAL HEALTH ROUNDING Patient sleeping: No. Patient alert and oriented: yes Behavior appropriate: Yes.  ; If no, describe:  Nutrition and fluids offered: Yes  Toileting and hygiene offered: Yes  Sitter present: No Law enforcement present: Yes  

## 2015-08-02 NOTE — ED Notes (Signed)
BEHAVIORAL HEALTH ROUNDING Patient sleeping: No. Patient alert and oriented: yes Behavior appropriate: Yes.  ; If no, describe:  Nutrition and fluids offered: Yes  Toileting and hygiene offered: Yes  Sitter present: no Law enforcement present: Yes  

## 2015-08-02 NOTE — ED Notes (Signed)
Chaplin at the bedside speaking with patient per pts request.

## 2015-08-02 NOTE — ED Notes (Signed)
Pt states she is hearing voices and has a "demon in her stomach." States she has been vomiting blood, states her voices are telling her to kill herself, however, she does not want to.

## 2015-08-02 NOTE — ED Notes (Signed)
Chaplain at bedside at pt request.

## 2015-08-02 NOTE — Consult Note (Signed)
[place] Psychiatry Consult   Reason for Consult:  Follow up Referring Physician:  ER Patient Identification: Annette Garrison MRN:  [id number] Principal Diagnosis: <principal problem not specified> Diagnosis:  There are no active problems to display for this patient.   Total Time spent with patient: 45 minutes  Subjective:   Annette Garrison is a 39 y.o. female patient admitted with substance abuse and smoking THC for 20 yrs with a break of 2 yrs and started smoking again for past one yr. Has been snorting Cocaine for 6 yrs.  Pt works at [employer]. Pt caller her aunt and told her that she has demons in her stomach which were put in at [facility] last Sunday. . In addition she has been seeing pastors and hearing voices her if she called Police on Shanon Brow and believes that Root put a curse on her.  HPI:  This is the it incident of a/v hallucinations and has not had any Mental Health problems or follow up before. HPI Elements:     Past Medical History:  Past Medical History  Diagnosis Date  . Hypertension     Past Surgical History  Procedure Laterality Date  . Cesarean section     Family History: No family history on file. Social History:  History  Alcohol Use No     History  Drug Use  . Yes  . Special: Marijuana, Cocaine    Social History   Social History  . Marital Status: Married    Spouse Name: N/A  . Number of Children: N/A  . Years of Education: N/A   Social History Main Topics  . Smoking status: Current Every Day Smoker -- 2.00 packs/day    Types: Cigarettes  . Smokeless tobacco: None  . Alcohol Use: No  . Drug Use: Yes    Special: Marijuana, Cocaine  . Sexual Activity: Not Asked   Other Topics Concern  . None   Social History Narrative  . None   Additional Social History:    Pain Medications: See MARs Prescriptions: See MARs Over the Counter: See MARs                     Allergies:  No Known Allergies  Labs:  Results for orders placed or  performed during the hospital encounter of 08/02/15 (from the past 48 hour(s))  Comprehensive metabolic panel     Status: Abnormal   Collection Time: 08/02/15  9:13 AM  Result Value Ref Range   Sodium 139 135 - 145 mmol/L   Potassium 4.0 3.5 - 5.1 mmol/L   Chloride 109 101 - 111 mmol/L   CO2 25 22 - 32 mmol/L   Glucose, Bld 96 65 - 99 mg/dL   BUN 8 6 - 20 mg/dL   Creatinine, Ser 0.97 0.44 - 1.00 mg/dL   Calcium 9.3 8.9 - 10.3 mg/dL   Total Protein 7.7 6.5 - 8.1 g/dL   Albumin 4.1 3.5 - 5.0 g/dL   AST 18 15 - 41 U/L   ALT 8 (L) 14 - 54 U/L   Alkaline Phosphatase 55 38 - 126 U/L   Total Bilirubin 0.2 (L) 0.3 - 1.2 mg/dL   GFR calc non Af Amer >60 >60 mL/min   GFR calc Af Amer >60 >60 mL/min    Comment: (NOTE) The eGFR has been calculated using the CKD EPI equation. This calculation has not been validated in all clinical situations. eGFR's persistently <60 mL/min signify possible Chronic Kidney Disease.  Anion gap 5 5 - 15  Ethanol (ETOH)     Status: None   Collection Time: 08/02/15  9:13 AM  Result Value Ref Range   Alcohol, Ethyl (B) <5 <5 mg/dL    Comment:        LOWEST DETECTABLE LIMIT FOR SERUM ALCOHOL IS 5 mg/dL FOR MEDICAL PURPOSES ONLY   Salicylate level     Status: None   Collection Time: 08/02/15  9:13 AM  Result Value Ref Range   Salicylate Lvl <0.3 2.8 - 30.0 mg/dL  Acetaminophen level     Status: Abnormal   Collection Time: 08/02/15  9:13 AM  Result Value Ref Range   Acetaminophen (Tylenol), Serum <10 (L) 10 - 30 ug/mL    Comment:        THERAPEUTIC CONCENTRATIONS VARY SIGNIFICANTLY. A RANGE OF 10-30 ug/mL MAY BE AN EFFECTIVE CONCENTRATION FOR MANY PATIENTS. HOWEVER, SOME ARE BEST TREATED AT CONCENTRATIONS OUTSIDE THIS RANGE. ACETAMINOPHEN CONCENTRATIONS >150 ug/mL AT 4 HOURS AFTER INGESTION AND >50 ug/mL AT 12 HOURS AFTER INGESTION ARE OFTEN ASSOCIATED WITH TOXIC REACTIONS.   CBC     Status: Abnormal   Collection Time: 08/02/15  9:13 AM   Result Value Ref Range   WBC 5.1 3.6 - 11.0 K/uL   RBC 4.42 3.80 - 5.20 MIL/uL   Hemoglobin 7.6 (L) 12.0 - 16.0 g/dL    Comment: RESULT REPEATED AND VERIFIED   HCT 26.9 (L) 35.0 - 47.0 %   MCV 60.9 (L) 80.0 - 100.0 fL   MCH 17.1 (L) 26.0 - 34.0 pg   MCHC 28.1 (L) 32.0 - 36.0 g/dL   RDW 19.1 (H) 11.5 - 14.5 %   Platelets 323 150 - 440 K/uL  Urine Drug Screen, Qualitative ([facility] only)     Status: Abnormal   Collection Time: 08/02/15  9:13 AM  Result Value Ref Range   Tricyclic, Ur Screen NONE DETECTED NONE DETECTED   Amphetamines, Ur Screen NONE DETECTED NONE DETECTED   MDMA (Ecstasy)Ur Screen NONE DETECTED NONE DETECTED   Cocaine Metabolite,Ur [place] POSITIVE (A) NONE DETECTED   Opiate, Ur Screen NONE DETECTED NONE DETECTED   Phencyclidine (PCP) Ur S NONE DETECTED NONE DETECTED   Cannabinoid 50 Ng, Ur [place] POSITIVE (A) NONE DETECTED   Barbiturates, Ur Screen NONE DETECTED NONE DETECTED   Benzodiazepine, Ur Scrn NONE DETECTED NONE DETECTED   Methadone Scn, Ur NONE DETECTED NONE DETECTED    Comment: (NOTE) 159  Tricyclics, urine               Cutoff 1000 ng/mL 200  Amphetamines, urine             Cutoff 1000 ng/mL 300  MDMA (Ecstasy), urine           Cutoff 500 ng/mL 400  Cocaine Metabolite, urine       Cutoff 300 ng/mL 500  Opiate, urine                   Cutoff 300 ng/mL 600  Phencyclidine (PCP), urine      Cutoff 25 ng/mL 700  Cannabinoid, urine              Cutoff 50 ng/mL 800  Barbiturates, urine             Cutoff 200 ng/mL 900  Benzodiazepine, urine           Cutoff 200 ng/mL 1000 Methadone, urine  Cutoff 300 ng/mL 1100 1200 The urine drug screen provides only a preliminary, unconfirmed 1300 analytical test result and should not be used for non-medical 1400 purposes. Clinical consideration and professional judgment should 1500 be applied to any positive drug screen result due to possible 1600 interfering substances. A more specific alternate chemical method 1700  must be used in order to obtain a confirmed analytical result.  1800 Gas chromato graphy / mass spectrometry (GC/MS) is the preferred 1900 confirmatory method.     Vitals: Blood pressure 154/108, pulse 103, temperature 97.7 F (36.5 C), temperature source Oral, resp. rate 18, height 5' 3"  (1.6 m), weight 131 lb (59.421 kg), SpO2 100 %.  Risk to Self: Is patient at risk for suicide?: Yes Risk to Others:   Prior Inpatient Therapy:   Prior Outpatient Therapy:    No current facility-administered medications for this encounter.   No current outpatient prescriptions on file.    Musculoskeletal: Strength & Muscle Tone: within normal limits Gait & Station: normal Patient leans: N/A  Psychiatric Specialty Exam: Physical Exam  Nursing note and vitals reviewed.   ROS  Blood pressure 154/108, pulse 103, temperature 97.7 F (36.5 C), temperature source Oral, resp. rate 18, height 5' 3"  (1.6 m), weight 131 lb (59.421 kg), SpO2 100 %.Body mass index is 23.21 kg/(m^2).  General Appearance: Casual  Eye Contact::  Fair  Speech:  Normal Rate  Volume:  Normal  Mood:  Anxious  Affect:  Labile  Thought Process:  Disorganized  Orientation:  Full (Time, Place, and Person)  Thought Content:  Hearing voices telling her to kill her children and seeing pastors and preachers and voices asking if she called Police on  A guy named Shanon Brow.  Root put a spell on her   Suicidal Thoughts:  No  Homicidal Thoughts:  Yes.  without intent/plan  Memory:  Immediate;   Fair Recent;   Fair Remote;   Fair adequate.  Judgement:  poor  Insight:  Lacking  Psychomotor Activity:  Normal  Concentration:  Fair  Recall:  [employer] of Knowledge:Poor  Language: Fair  Akathisia:  No  Handed:  Right  AIMS (if indicated):     Assets:  Housing Social Support Vocational/Educational  ADL's:  Intact  Cognition: WNL  Sleep:      Medical Decision Making: Review of Psycho-Social Stressors (1)  Treatment Plan  Summary: Plan Will start pt on snti-psychotic meds and observe for re-eval in AM of 08/04/2015  Plan:  No evidence of imminent risk to self or others at present.   Disposition: as above.  Dewain Penning 08/02/2015 4:05 PM

## 2015-08-02 NOTE — ED Notes (Signed)
Waiting for Tylenol from pharmacy.

## 2015-08-02 NOTE — ED Notes (Signed)
States she feels something moving in her stomach, she is convinced it is a Ambulance person.

## 2015-08-03 LAB — PREGNANCY, URINE: Preg Test, Ur: NEGATIVE

## 2015-08-03 NOTE — ED Notes (Signed)
Pt given lunch tray with sprite 

## 2015-08-03 NOTE — ED Provider Notes (Signed)
-----------------------------------------   5:58 AM on 08/03/2015 -----------------------------------------   Blood pressure 132/76, pulse 83, temperature 98.4 F (36.9 C), temperature source Oral, resp. rate 18, height  (1.6 m), weight 131 lb (59.421 kg), SpO2 100 %.  The patient had no acute events since last update.  Calm and cooperative at this time.  Disposition is pending per Psychiatry/Behavioral Medicine team recommendations.     Sharyn Creamer, MD 08/03/15 [phone]

## 2015-08-03 NOTE — ED Notes (Addendum)
ENVIRONMENTAL ASSESSMENT  Potentially harmful objects out of patient reach: Yes.  Personal belongings secured: Yes.  Patient dressed in hospital provided attire only: Yes.  Plastic bags out of patient reach: Yes.  Patient care equipment (cords, cables, call bells, lines, and drains) shortened, removed, or accounted for: Yes.  Equipment and supplies removed from bottom of stretcher: Yes.  Potentially toxic materials out of patient reach: Yes.  Sharps container removed or out of patient reach: Yes.   ED BHU PLACEMENT JUSTIFICATION  Is the patient under IVC or is there intent for IVC: Yes.  Is the patient medically cleared: Yes.  Is there vacancy in the ED BHU: Yes.  Is the population mix appropriate for patient: Yes.  Is the patient awaiting placement in inpatient or outpatient setting: Yes.  Has the patient had a psychiatric consult: Yes.  Survey of unit performed for contraband, proper placement and condition of furniture, tampering with fixtures in bathroom, shower, and each patient room: Yes. ; Findings: All clear  APPEARANCE/BEHAVIOR  calm, cooperative and adequate rapport can be established  NEURO ASSESSMENT  Orientation: time, place and person  Hallucinations: No.None noted (Hallucinations)  Speech: Normal  Gait: normal  RESPIRATORY ASSESSMENT  WNL  CARDIOVASCULAR ASSESSMENT  WNL  GASTROINTESTINAL ASSESSMENT  WNL  EXTREMITIES  WNL  PLAN OF CARE  Provide calm/safe environment. Vital signs assessed twice daily. ED BHU Assessment once each 12-hour shift. Collaborate with intake RN daily or as condition indicates. Assure the ED provider has rounded once each shift. Provide and encourage hygiene. Provide redirection as needed. Assess for escalating behavior; address immediately and inform ED provider.  Assess family dynamic and appropriateness for visitation as needed: Yes. ; If necessary, describe findings:  Educate the patient/family about BHU procedures/visitation: Yes. ; If  necessary, describe findings: Pt is calm and cooperative at this time. Pt understanding and accepting of unit procedures/rules. Will continue to monitor.  

## 2015-08-03 NOTE — ED Notes (Signed)
BEHAVIORAL HEALTH ROUNDING Patient sleeping: No. Patient alert and oriented: yes Behavior appropriate: Yes.  ; If no, describe:  Nutrition and fluids offered: Yes  Toileting and hygiene offered: Yes  Sitter present: no Law enforcement present: Yes  

## 2015-08-03 NOTE — ED Notes (Signed)
Pt given supper tray and sprite 

## 2015-08-03 NOTE — Consult Note (Signed)
  Pt seen in [facility] - ER. S Pt is seen for re-eval. Staff reports that she told them that since she passed a big clot through [place] she is a lot better and is still religiously pre-occupied. Had a good visit from her aunt. O Alert and ox3. Co-operative. Excited about her birthday coming up tomorrow.  No agitation. Denies feeling depressed. Denies a/v hallucinations as God is taking care of them. Hyper - religious. Pre-occupied about religion. Denies s/h idea or plans and I/J guarded. Impulse control is poor. Imp Psychosis - R/O Substance Induced.' Cocaine and THC  REc Observe today and for re-eval on 08/04/2015 for appropriate disposition and follow.

## 2015-08-03 NOTE — ED Notes (Signed)
BEHAVIORAL HEALTH ROUNDING Patient sleeping: Yes.   Patient alert and oriented: yes Behavior appropriate: Yes.  ; If no, describe:  Nutrition and fluids offered: No Toileting and hygiene offered: Yes  Sitter present: no Law enforcement present: Yes  

## 2015-08-03 NOTE — ED Notes (Signed)
BEHAVIORAL HEALTH ROUNDING  Patient sleeping: No.  Patient alert and oriented: yes  Behavior appropriate: Yes. ; If no, describe:  Nutrition and fluids offered: Yes  Toileting and hygiene offered: Yes  Sitter present: not applicable  Law enforcement present: Yes ODS  

## 2015-08-03 NOTE — ED Notes (Signed)

## 2015-08-03 NOTE — ED Notes (Signed)
Called to pts room at pts request. Pt started saying that she was better, no longer hearing voices. Pt stated that she passed a large blood clot from her vagina and the voices stopped.

## 2015-08-03 NOTE — ED Notes (Signed)
Pt given breakfast tray. Vitals taken. Pt stated she did not like breakfast and was told she could get crackers. She was fine with that.

## 2015-08-04 DIAGNOSIS — N946 Dysmenorrhea, unspecified: Secondary | ICD-10-CM

## 2015-08-04 DIAGNOSIS — F14151 Cocaine abuse with cocaine-induced psychotic disorder with hallucinations: Secondary | ICD-10-CM | POA: Diagnosis not present

## 2015-08-04 DIAGNOSIS — I10 Essential (primary) hypertension: Secondary | ICD-10-CM

## 2015-08-04 DIAGNOSIS — F1410 Cocaine abuse, uncomplicated: Secondary | ICD-10-CM

## 2015-08-04 NOTE — ED Notes (Signed)
BEHAVIORAL HEALTH ROUNDING Patient sleeping: Yes.   Patient alert and oriented: not applicable SLEEPING Behavior appropriate: Yes.  ; If no, describe: SLEEPING Nutrition and fluids offered: No SLEEPING Toileting and hygiene offered: NoSLEEPING Sitter present: not applicable Law enforcement present: Yes ODS 

## 2015-08-04 NOTE — ED Notes (Signed)
Patient resting comfortably in room. No complaints or concerns voiced. No distress or abnormal behavior noted. Will continue to monitor with security cameras. Q 15 minute rounds continue. 

## 2015-08-04 NOTE — ED Notes (Signed)
Pt has taken a shower.  

## 2015-08-04 NOTE — ED Notes (Signed)
Patient in dayroom.  Patient is alert and oriented. No evidence of psychosis. Patient states she feels safe to be discharged. Awaiting psychiatrist. Maintained on 15 minute checks and observation by security camera for safety.

## 2015-08-04 NOTE — ED Notes (Signed)
Patient discharged ambulatory to home, accompanied by family. She denies SI or HI. Discharge instructions reviewed with patient, she verbalizes understanding. Patient received copy of discharge plan and all personal belongings.

## 2015-08-04 NOTE — ED Notes (Signed)
Maintained on 15 minute checks and observation by security camera for safety. 

## 2015-08-04 NOTE — ED Provider Notes (Signed)
-----------------------------------------   6:33 AM on 08/04/2015 -----------------------------------------   Blood pressure 138/86, pulse 110, temperature 98 F (36.7 C), temperature source Oral, resp. rate 16, height  (1.6 m), weight 131 lb (59.421 kg), SpO2 100 %.  The patient had no acute events since last update.  Calm and cooperative at this time.  Disposition is pending per Psychiatry/Behavioral Medicine team recommendations.     Irean Hong, MD 08/04/15 [phone]

## 2015-08-04 NOTE — ED Notes (Signed)
Meal given

## 2015-08-04 NOTE — Consult Note (Signed)
[facility] Face-to-Face Psychiatry Consult   Reason for Consult:  Consult for this 39 year old woman brought to the emergency room because of 2 or 3 days of hallucinations Referring Physician:  Inocencio Homes Patient Identification: Annette Garrison MRN:  [id number] Principal Diagnosis: Cocaine abuse with cocaine-induced psychotic disorder Diagnosis:   Patient Active Problem List   Diagnosis Date Noted  . 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 Dysmenorrhea [N94.6] 08/04/2015  . Hypertension [I10] 08/04/2015    Total Time spent with patient: 1 hour  Subjective:   Annette Garrison is a 39 y.o. female patient admitted with "I just had a bad episode and was hearing voices".  HPI:  Information from the patient and the chart. 39 year old woman who claims to have no previous psychiatric history. She says that this weekend she used some cocaine at a party. She suspects that it may have been spiked with something else. She started having auditory hallucinations. The hallucinations were saying that they were going to kill her children. Patient clarifies to me that at no point were they telling her to kill her own children nor did she have any thoughts or wish to do anything to harm her children. She is been sleeping poorly for a couple days. She's been nervous and anxious. She is consumed with obsessive worries about her ex-boyfriend and the belief that he thinks that she put a root on him. All of these symptoms have now improved. Patient is denying currently having any hallucinations. No longer feels confused. She is showing improved insight. Has had no aggressive or dangerous behavior here in the emergency room. Denies suicidal or homicidal ideation. Says that she uses cocaine only intermittently once every couple weeks or so. His admit that she feels like her life is been somewhat chaotic and out of control recently.  Past psychiatric history: No previous psychiatric hospitalizations.  Denies any suicidal or violent behavior. Denies any previous prescriptions for any psychiatric medicine.  Medical history: Patient says she has very heavy menstrual periods that of required blood transfusions in the past. She also has elevated blood pressure.  Social history: Lives with her 4 children. Had been in between work recently but was hoping to get back to work in the next couple days. Currently her children are staying with her mother. Married.  Family history: Knows of no family history of mental health or substance abuse problems.  Current medication none HPI Elements:   Quality:  Auditory hallucinations and confusion. Severity:  Moderate to severe. Timing:  Present for just about 48-72 hours. Duration:  Now resolved. Context:  Recent cocaine abuse.  Past Medical History:  Past Medical History  Diagnosis Date  . Hypertension     Past Surgical History  Procedure Laterality Date  . Cesarean section     Family History: No family history on file. Social History:  History  Alcohol Use No     History  Drug Use  . Yes  . Special: Marijuana, Cocaine    Social History   Social History  . Marital Status: Married    Spouse Name: N/A  . Number of Children: N/A  . Years of Education: N/A   Social History Main Topics  . Smoking status: Current Every Day Smoker -- 2.00 packs/day    Types: Cigarettes  . Smokeless tobacco: None  . Alcohol Use: No  . Drug Use: Yes    Special: Marijuana, Cocaine  . Sexual Activity: Not Asked   Other Topics Concern  .  None   Social History Narrative  . None   Additional Social History:    Pain Medications: See MARs Prescriptions: See MARs Over the Counter: See MARs History of alcohol / drug use?: Yes Longest period of sobriety (when/how long): 2years Name of Substance 1: Marijuana 1 - Age of First Use: 18 1 - Amount (size/oz): half of a blunt 1 - Frequency: daily 1 - Duration: 20years 1 - Last Use / Amount: 07/01/2015 Name  of Substance 2: cocaine 2 - Age of First Use: 36 2 - Amount (size/oz): varies 2 - Frequency: varies 2 - Duration: on/off 39yrs 2 - Last Use / Amount: 07/01/2015                 Allergies:  No Known Allergies  Labs: No results found for this or any previous visit (from the past 48 hour(s)).  Vitals: Blood pressure 147/78, pulse 77, temperature 98.4 F (36.9 C), temperature source Oral, resp. rate 18, height  (1.6 m), weight 59.421 kg (131 lb), SpO2 99 %.  Risk to Self: Suicidal Ideation: No-Not Currently/Within Last 6 Months Suicidal Intent: No-Not Currently/Within Last 6 Months Is patient at risk for suicide?: No Suicidal Plan?: No-Not Currently/Within Last 6 Months Access to Means: No What has been your use of drugs/alcohol within the last 12 months?: marijuana, cocaine How many times?: 0 Intentional Self Injurious Behavior: None Risk to Others: Homicidal Ideation: No (Per daughter the patient has hearing voices to kill kids) Thoughts of Harm to Others:  (Per family she has voices telling to kill childrend) Current Homicidal Plan: No Identified Victim: her 4 children History of harm to others?: No Assessment of Violence: None Noted Criminal Charges Pending?: No Does patient have a court date: No Prior Inpatient Therapy: Prior Inpatient Therapy: No Prior Outpatient Therapy: Prior Outpatient Therapy: No Does patient have an ACCT team?: No Does patient have Intensive In-House Services?  : No Does patient have [facility] services? : No Does patient have P4[facility] services?: No  Current Facility-Administered Medications  Medication Dose Route Frequency Provider Last Rate Last Dose  . risperiDONE (RISPERDAL M-TABS) disintegrating tablet 1 mg  1 mg Oral BID Beau Fanny, MD   1 mg at 08/04/15 0957   No current outpatient prescriptions on file.    Musculoskeletal: Strength & Muscle Tone: within normal limits Gait & Station: normal Patient leans: N/A  Psychiatric  Specialty Exam: Physical Exam  Nursing note and vitals reviewed. Constitutional: She appears well-developed and well-nourished.  HENT:  Head: Normocephalic and atraumatic.  Eyes: Conjunctivae are normal. Pupils are equal, round, and reactive to light.  Neck: Normal range of motion.  Cardiovascular: Normal heart sounds.   Respiratory: Effort normal.  GI: Soft.  Musculoskeletal: Normal range of motion.  Neurological: She is alert.  Skin: Skin is warm and dry.  Psychiatric: Her speech is normal and behavior is normal. Thought content normal. Her mood appears anxious. She expresses impulsivity. She exhibits abnormal recent memory.    Review of Systems  Constitutional: Negative.   HENT: Negative.   Eyes: Negative.   Respiratory: Negative.   Cardiovascular: Negative.   Gastrointestinal: Negative.   Musculoskeletal: Negative.   Skin: Negative.   Neurological: Negative.   Psychiatric/Behavioral: Positive for hallucinations, memory loss and substance abuse. Negative for depression and suicidal ideas. The patient has insomnia. The patient is not nervous/anxious.     Blood pressure 147/78, pulse 77, temperature 98.4 F (36.9 C), temperature source Oral, resp. rate 18, height  (  1.6 m), weight 59.421 kg (131 lb), SpO2 99 %.Body mass index is 23.21 kg/(m^2).  General Appearance: Casual  Eye Contact::  Fair  Speech:  Normal Rate  Volume:  Normal  Mood:  Euthymic  Affect:  Appropriate  Thought Process:  Goal Directed  Orientation:  Full (Time, Place, and Person)  Thought Content:  Negative  Suicidal Thoughts:  No  Homicidal Thoughts:  No  Memory:  Immediate;   Good Recent;   Fair Remote;   Fair  Judgement:  Fair  Insight:  Fair  Psychomotor Activity:  Normal  Concentration:  Fair  Recall:  Poor  Fund of Knowledge:Fair  Language: Fair  Akathisia:  No  Handed:  Right  AIMS (if indicated):     Assets:  Communication Skills Desire for Improvement Housing Intimacy Physical  Health Social Support  ADL's:  Intact  Cognition: WNL  Sleep:      Medical Decision Making: New problem, with additional work up planned, Review of Psycho-Social Stressors (1), Review or order clinical lab tests (1), Review or order medicine tests (1) and Review of Medication Regimen & Side Effects (2)  Treatment Plan Summary: Plan Patient can be discharged from the emergency room to home. She is instructed to follow-up with outpatient substance abuse treatment and engage in every effort to stop abusing cocaine and other drugs.  Plan:  No evidence of imminent risk to self or others at present.   Patient does not meet criteria for psychiatric inpatient admission. Supportive therapy provided about ongoing stressors. Disposition: Patient is going to be discharged from the emergency room. No indication for new prescriptions to be written. Counseling done about her substance abuse and the importance of staying sober. Patient will be referred to [facility] for outpatient mental health treatment. Labs reviewed. Case discussed with emergency room physician.  John Clapacs 08/04/2015 4:58 PM

## 2015-08-04 NOTE — ED Notes (Signed)
Patient asleep. Maintained on 15 minute checks and observation by security camera for safety.

## 2015-08-04 NOTE — ED Provider Notes (Signed)
-----------------------------------------   4:15 PM on 08/04/2015 -----------------------------------------  The patient has been evaluated by Dr. Delaney Meigs of psychiatry. He has rescinded IVC and recommends discharge as the patient is no longer suffering from psychosis in the setting of polysubstance abuse. I reviewed her labs which are notable for anemia with hemoglobin of 7.6 however her anemia seems chronic and stable on chart review. Recommend PCP follow-up. We'll DC with return precautions, PCP follow-up.  Gayla Doss, MD 08/04/15 [phone]

## 2015-08-04 NOTE — ED Notes (Signed)

## 2016-01-27 ENCOUNTER — Emergency Department
Admission: EM | Admit: 2016-01-27 | Discharge: 2016-01-27 | Disposition: A | Discharge status: Home / Self Care | Payer: Medicaid Other | Attending: Emergency Medicine | Admitting: Emergency Medicine

## 2016-01-27 ENCOUNTER — Encounter: Payer: Self-pay | Admitting: Emergency Medicine

## 2016-01-27 DIAGNOSIS — Y9389 Activity, other specified: Secondary | ICD-10-CM | POA: Insufficient documentation

## 2016-01-27 DIAGNOSIS — I10 Essential (primary) hypertension: Secondary | ICD-10-CM | POA: Insufficient documentation

## 2016-01-27 DIAGNOSIS — F17210 Nicotine dependence, cigarettes, uncomplicated: Secondary | ICD-10-CM | POA: Insufficient documentation

## 2016-01-27 DIAGNOSIS — Y998 Other external cause status: Secondary | ICD-10-CM | POA: Diagnosis not present

## 2016-01-27 DIAGNOSIS — S01511A Laceration without foreign body of lip, initial encounter: Secondary | ICD-10-CM | POA: Insufficient documentation

## 2016-01-27 DIAGNOSIS — W19XXXA Unspecified fall, initial encounter: Secondary | ICD-10-CM

## 2016-01-27 DIAGNOSIS — W010XXA Fall on same level from slipping, tripping and stumbling without subsequent striking against object, initial encounter: Secondary | ICD-10-CM | POA: Diagnosis not present

## 2016-01-27 DIAGNOSIS — Y92038 Other place in apartment as the place of occurrence of the external cause: Secondary | ICD-10-CM | POA: Insufficient documentation

## 2016-01-27 DIAGNOSIS — S0990XA Unspecified injury of head, initial encounter: Secondary | ICD-10-CM | POA: Diagnosis present

## 2016-01-27 MED ORDER — HYDROCODONE-ACETAMINOPHEN 5-325 MG PO TABS: 1.0000 | ORAL_TABLET | ORAL | Status: DC | PRN

## 2016-01-27 MED ORDER — OXYCODONE-ACETAMINOPHEN 5-325 MG PO TABS
1.0000 | ORAL_TABLET | Freq: Once | ORAL | Status: AC
  Administered 2016-01-27: 1 via ORAL
  Filled 2016-01-27: qty 1

## 2016-01-27 NOTE — ED Notes (Signed)
Pt in via triage; pt reports she tripped over mop in hall way of apartment complex, landing face first into door frame.  Pt with laceration to right upper lip, contusion to right forehead.  Pt denies LOC at time of fall.  Pt with complaints of pain to head, mouth, neck.  Pt A/Ox4, no immediate distress at this time.

## 2016-01-27 NOTE — Discharge Instructions (Signed)
Mouth Laceration °A mouth laceration is a deep cut in the lining of your mouth (mucosa). The laceration may extend into your lip or go all of the way through your mouth and cheek. Lacerations inside your mouth may involve your tongue, the insides of your cheeks, or the upper surface of your mouth (palate). °Mouth lacerations may bleed a lot because your mouth has a very rich blood supply. Mouth lacerations may need to be repaired with stitches (sutures). °CAUSES °Any type of facial injury can cause a mouth laceration. Common causes include: °· Getting hit in the mouth. °· Being in a car accident. °SYMPTOMS °The most common sign of a mouth laceration is bleeding that fills the mouth. °DIAGNOSIS °Your health care provider can diagnose a mouth laceration by examining your mouth. Your mouth may need to be washed out (irrigated) with a sterile salt-water (saline) solution. Your health care provider may also have to remove any blood clots to determine how bad your injury is. You may need X-rays of the bones in your jaw or your face to rule out other injuries, such as dental injuries, facial fractures, or jaw fractures. °TREATMENT °Treatment depends on the location and severity of your injury. Small mouth lacerations may not need treatment if bleeding has stopped. You may need sutures if: °· You have a tongue laceration. °· Your mouth laceration is large or deep, or it continues to bleed. °If sutures are necessary, your health care provider will use absorbable sutures that dissolve as your body heals. You may also receive antibiotic medicine or a tetanus shot. °HOME CARE INSTRUCTIONS °· Take medicines only as directed by your health care provider. °· If you were prescribed an antibiotic medicine, finish all of it even if you start to feel better. °· Eat as directed by your health care provider. You may only be able to drink liquids or eat soft foods for a few days. °· Rinse your mouth with a warm, salt-water rinse 4-6  times per day or as directed by your health care provider. You can make a salt-water rinse by mixing one tsp of salt into two cups of warm water. °· Do not poke the sutures with your tongue. Doing that can loosen them. °· Check your wound every day for signs of infection. It is normal to have a white or gray patch over your wound while it heals. Watch for: °¨ Redness. °¨ Swelling. °¨ Blood or pus. °· Maintain regular oral hygiene, if possible. Gently brush your teeth with a soft, nylon-bristled toothbrush 2 times per day. °· Keep all follow-up visits as directed by your health care provider. This is important. °SEEK MEDICAL CARE IF: °· You were given a tetanus shot and have swelling, severe pain, redness, or bleeding at the injection site. °· You have a fever. °· Your pain is not controlled with medicine. °· You have redness, swelling, or pain at your wound that is getting worse. °· You have fresh bleeding or pus coming from your wound. °· The edges of your wound break open. °· You develop swollen, tender glands in your throat. °SEEK IMMEDIATE MEDICAL CARE IF:  °· Your face or the area under your jaw becomes swollen. °· You have trouble breathing or swallowing. °  °This information is not intended to replace advice given to you by your health care provider. Make sure you discuss any questions you have with your health care provider. °  °Document Released: 11/01/2005 Document Revised: 03/18/2015 Document Reviewed: 10/23/2014 °Elsevier Interactive Patient   Education ©2016 [employer] ° °

## 2016-01-27 NOTE — ED Provider Notes (Signed)
[facility] Emergency Department Provider Note  ____________________________________________  Time seen: Approximately 5:50 PM  I have reviewed the triage vital signs and the nursing notes.   HISTORY  Chief Complaint Fall    HPI Annette Garrison is a 40 y.o. female presents to the emergency department post fall with multiple cuts to her upper lip. Patient states that she tripped over a mop in the hallway, ran to catch herself,  and then ran head first into a door frame, simultaneously biting down on her lower lip. Post-injury patient complains headache from where she hit her head. Patient denies loss of consciousness, dizziness, diplopia, blurry vision, numbness, tingling, nausea, or vomiting. Patient said says ice has helped with the swelling. Patient rates pain at 8 out of 10.    Past Medical History  Diagnosis Date  . Hypertension     Patient Active Problem List   Diagnosis Date Noted  . 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 Dysmenorrhea 08/04/2015  . Hypertension 08/04/2015    Past Surgical History  Procedure Laterality Date  . Cesarean section      Current Outpatient Rx  Name  Route  Sig  Dispense  Refill  . HYDROcodone-acetaminophen (NORCO/VICODIN) 5-325 MG tablet   Oral   Take 1 tablet by mouth every 4 (four) hours as needed for moderate pain.   20 tablet   0     Allergies Review of patient's allergies indicates no known allergies.  No family history on file.  Social History Social History  Substance Use Topics  . Smoking status: Current Every Day Smoker -- 2.00 packs/day    Types: Cigarettes  . Smokeless tobacco: None  . Alcohol Use: No     Review of Systems  Constitutional: . No fever/chills, no loss of consciousness Eyes: No visual changes.  No discharge Nose: no discharge Mouth: Pain, swelling and bleeding of upper lip.  Gastrointestinal: No abdominal pain.  No nausea, no vomiting.    Musculoskeletal: Negative for neck pain. Skin: Negative for rash. Neurological: Negative for headaches, focal weakness or numbness. 10-point ROS otherwise negative.  ____________________________________________   PHYSICAL EXAM:  VITAL SIGNS: ED Triage Vitals  Enc Vitals Group     BP 01/27/16 1749 132/89 mmHg     Pulse Rate 01/27/16 1749 100     Resp --      Temp --      Temp src --      SpO2 01/27/16 1749 100 %     Weight 01/27/16 1749 130 lb (58.968 kg)     Height 01/27/16 1749  (1.6 m)     Head Cir --      Peak Flow --      Pain Score 01/27/16 1737 4     Pain Loc --      Pain Edu? --      Excl. in GC? --      Constitutional: Alert and oriented. Well appearing and in no acute distress. Eyes: Conjunctivae are normal. PERRL. EOMI. Head: Contusion 4 cm in diameter on right frontal lobe. ENT:      Nose: No congestion/rhinnorhea.      Mouth/Throat: Multiple lacerations across upper vermilion that do not cross the vermilion border.    Tender to palpation, erythematous with coagulated blood crusted along edges of lacerations. Edges of lacerations unable to approximate due to trauma of surrounding tissues.  Neck: No cervical spine tenderness to palpation. Gastrointestinal: Soft and nontender. No distention.  Musculoskeletal: No lower extremity tenderness nor edema.  No joint effusions. Neurologic:  Normal speech and language. No gross focal neurologic deficits are appreciated. Cranial Nerves II-XII intact.  Skin:  Skin is warm, dry and intact. No rash noted.    ____________________________________________   INITIAL IMPRESSION / ASSESSMENT AND PLAN / ED COURSE  Pertinent labs & imaging results that were available during my care of the patient were reviewed by me and considered in my medical decision making (see chart for details).  Patient's diagnosis is consistent with lip lacerations. Patient will be discharged home with prescriptions for Vicodin. Patient is to  follow up with ENT if symptoms persist past this treatment course. Patient is given ED precautions to return to the ED for any worsening or new symptoms.     ____________________________________________  FINAL CLINICAL IMPRESSION(S) / ED DIAGNOSES  Final diagnoses:  Lip laceration, initial encounter  Fall, initial encounter      NEW MEDICATIONS STARTED DURING THIS VISIT:  Discharge Medication List as of 01/27/2016  6:46 PM    START taking these medications   Details  HYDROcodone-acetaminophen (NORCO/VICODIN) 5-325 MG tablet Take 1 tablet by mouth every 4 (four) hours as needed for moderate pain., Starting 01/27/2016, Until Discontinued, Print            This chart was dictated using voice recognition software/Dragon. Despite best efforts to proofread, errors can occur which can change the meaning. Any change was purely unintentional.    Racheal PatchesJonathan D Anni Hocevar, PA-C 01/27/16 1943  Governor Rooksebecca Lord, MD 01/27/16 2321

## 2016-01-27 NOTE — ED Notes (Signed)
States she fell face first  Laceration noted to lip and some pain to neck

## 2016-02-24 ENCOUNTER — Encounter: Payer: Self-pay | Admitting: Emergency Medicine

## 2016-02-24 ENCOUNTER — Emergency Department: Payer: Medicaid Other

## 2016-02-24 ENCOUNTER — Emergency Department
Admission: EM | Admit: 2016-02-24 | Discharge: 2016-02-24 | Disposition: A | Discharge status: Home / Self Care | Payer: Medicaid Other | Attending: Emergency Medicine | Admitting: Emergency Medicine

## 2016-02-24 DIAGNOSIS — D649 Anemia, unspecified: Secondary | ICD-10-CM | POA: Insufficient documentation

## 2016-02-24 DIAGNOSIS — F14159 Cocaine abuse with cocaine-induced psychotic disorder, unspecified: Secondary | ICD-10-CM | POA: Insufficient documentation

## 2016-02-24 DIAGNOSIS — F17210 Nicotine dependence, cigarettes, uncomplicated: Secondary | ICD-10-CM | POA: Diagnosis not present

## 2016-02-24 DIAGNOSIS — R55 Syncope and collapse: Secondary | ICD-10-CM | POA: Diagnosis not present

## 2016-02-24 DIAGNOSIS — I10 Essential (primary) hypertension: Secondary | ICD-10-CM | POA: Insufficient documentation

## 2016-02-24 DIAGNOSIS — R531 Weakness: Secondary | ICD-10-CM | POA: Diagnosis present

## 2016-02-24 DIAGNOSIS — F329 Major depressive disorder, single episode, unspecified: Secondary | ICD-10-CM | POA: Diagnosis not present

## 2016-02-24 LAB — CBC
HEMATOCRIT: 25.8 % — AB (ref 35.0–47.0)
Hemoglobin: 7.5 g/dL — ABNORMAL LOW (ref 12.0–16.0)
MCH: 17.2 pg — AB (ref 26.0–34.0)
MCHC: 28.9 g/dL — ABNORMAL LOW (ref 32.0–36.0)
MCV: 59.4 fL — AB (ref 80.0–100.0)
Platelets: 322 10*3/uL (ref 150–440)
RBC: 4.34 MIL/uL (ref 3.80–5.20)
RDW: 20.3 % — ABNORMAL HIGH (ref 11.5–14.5)
WBC: 6.7 10*3/uL (ref 3.6–11.0)

## 2016-02-24 LAB — URINE DRUG SCREEN, QUALITATIVE (ARMC ONLY)
Amphetamines, Ur Screen: NOT DETECTED
BARBITURATES, UR SCREEN: NOT DETECTED
BENZODIAZEPINE, UR SCRN: NOT DETECTED
CANNABINOID 50 NG, UR ~~LOC~~: NOT DETECTED
COCAINE METABOLITE, UR ~~LOC~~: POSITIVE — AB
MDMA (Ecstasy)Ur Screen: NOT DETECTED
METHADONE SCREEN, URINE: NOT DETECTED
OPIATE, UR SCREEN: NOT DETECTED
Phencyclidine (PCP) Ur S: NOT DETECTED
TRICYCLIC, UR SCREEN: NOT DETECTED

## 2016-02-24 LAB — URINALYSIS COMPLETE WITH MICROSCOPIC (ARMC ONLY)
BACTERIA UA: NONE SEEN
BILIRUBIN URINE: NEGATIVE
GLUCOSE, UA: NEGATIVE mg/dL
HGB URINE DIPSTICK: NEGATIVE
Ketones, ur: NEGATIVE mg/dL
Leukocytes, UA: NEGATIVE
NITRITE: NEGATIVE
PH: 6 (ref 5.0–8.0)
Protein, ur: NEGATIVE mg/dL
SPECIFIC GRAVITY, URINE: 1.006 (ref 1.005–1.030)

## 2016-02-24 LAB — POCT PREGNANCY, URINE: Preg Test, Ur: NEGATIVE

## 2016-02-24 LAB — BASIC METABOLIC PANEL
Anion gap: 3 — ABNORMAL LOW (ref 5–15)
BUN: 7 mg/dL (ref 6–20)
CHLORIDE: 106 mmol/L (ref 101–111)
CO2: 24 mmol/L (ref 22–32)
CREATININE: 1.08 mg/dL — AB (ref 0.44–1.00)
Calcium: 8.8 mg/dL — ABNORMAL LOW (ref 8.9–10.3)
GFR calc non Af Amer: >60 mL/min (ref >60)
Glucose, Bld: 75 mg/dL (ref 65–99)
POTASSIUM: 3.7 mmol/L (ref 3.5–5.1)
SODIUM: 133 mmol/L — AB (ref 135–145)

## 2016-02-24 MED ORDER — MORPHINE SULFATE (PF) 4 MG/ML IV SOLN
4.0000 mg | Freq: Once | INTRAVENOUS | Status: AC
Start: 2016-02-24 — End: 2016-02-24
  Administered 2016-02-24: 4 mg via INTRAVENOUS
  Filled 2016-02-24: qty 1

## 2016-02-24 MED ORDER — ONDANSETRON HCL 4 MG/2ML IJ SOLN
4.0000 mg | Freq: Once | INTRAMUSCULAR | Status: AC
  Administered 2016-02-24: 4 mg via INTRAVENOUS
  Filled 2016-02-24: qty 2

## 2016-02-24 MED ORDER — SODIUM CHLORIDE 0.9 % IV BOLUS (SEPSIS)
500.0000 mL | INTRAVENOUS | Status: AC
  Administered 2016-02-24: 500 mL via INTRAVENOUS

## 2016-02-24 NOTE — ED Notes (Signed)
Pt to ed with c/o syncopal episode today at work.  Pt states now with severe headache.  Pt reports feeling weak all over now.

## 2016-02-24 NOTE — Discharge Instructions (Signed)
You have been seen today in the Emergency Department (ED)  for syncope (passing out).  Your workup including labs and EKG show reassuring results.  Your symptoms may be due to dehydration, so it is important that you drink plenty of non-alcoholic fluids. ° °Please call your regular doctor as soon as possible to schedule the next available clinic appointment to follow up with him/her regarding your visit to the ED and your symptoms.  Return to the Emergency Department (ED)  if you have any further syncopal episodes (pass out again) or develop ANY chest pain, pressure, tightness, trouble breathing, sudden sweating, or other symptoms that concern you. ° ° ° °Syncope °Syncope is a medical term for fainting or passing out. This means you lose consciousness and drop to the ground. People are generally unconscious for less than 5 minutes. You may have some muscle twitches for up to 15 seconds before waking up and returning to normal. Syncope occurs more often in older adults, but it can happen to anyone. While most causes of syncope are not dangerous, syncope can be a sign of a serious medical problem. It is important to seek medical care.  °CAUSES  °Syncope is caused by a sudden drop in blood flow to the brain. The specific cause is often not determined. Factors that can bring on syncope include: °· Taking medicines that lower blood pressure. °· Sudden changes in posture, such as standing up quickly. °· Taking more medicine than prescribed. °· Standing in one place for too long. °· Seizure disorders. °· Dehydration and excessive exposure to heat. °· Low blood sugar (hypoglycemia). °· Straining to have a bowel movement. °· Heart disease, irregular heartbeat, or other circulatory problems. °· Fear, emotional distress, seeing blood, or severe pain. °SYMPTOMS  °Right before fainting, you may: °· Feel dizzy or light-headed. °· Feel nauseous. °· See all white or all black in your field of vision. °· Have cold, clammy  skin. °DIAGNOSIS  °Your health care provider will ask about your symptoms, perform a physical exam, and perform an electrocardiogram (ECG) to record the electrical activity of your heart. Your health care provider may also perform other heart or blood tests to determine the cause of your syncope which may include: °· Transthoracic echocardiogram (TTE). During echocardiography, sound waves are used to evaluate how blood flows through your heart. °· Transesophageal echocardiogram (TEE). °· Cardiac monitoring. This allows your health care provider to monitor your heart rate and rhythm in real time. °· Holter monitor. This is a portable device that records your heartbeat and can help diagnose heart arrhythmias. It allows your health care provider to track your heart activity for several days, if needed. °· Stress tests by exercise or by giving medicine that makes the heart beat faster. °TREATMENT  °In most cases, no treatment is needed. Depending on the cause of your syncope, your health care provider may recommend changing or stopping some of your medicines. °HOME CARE INSTRUCTIONS °· Have someone stay with you until you feel stable. °· Do not drive, use machinery, or play sports until your health care provider says it is okay. °· Keep all follow-up appointments as directed by your health care provider. °· Lie down right away if you start feeling like you might faint. Breathe deeply and steadily. Wait until all the symptoms have passed. °· Drink enough fluids to keep your urine clear or pale yellow. °· If you are taking blood pressure or heart medicine, get up slowly and take several minutes to sit   and then stand. This can reduce dizziness. °SEEK IMMEDIATE MEDICAL CARE IF:  °· You have a severe headache. °· You have unusual pain in the chest, abdomen, or back. °· You are bleeding from your mouth or rectum, or you have black or tarry stool. °· You have an irregular or very fast heartbeat. °· You have pain with  breathing. °· You have repeated fainting or seizure-like jerking during an episode. °· You faint when sitting or lying down. °· You have confusion. °· You have trouble walking. °· You have severe weakness. °· You have vision problems. °If you fainted, call your local emergency services (911 in [place] Do not drive yourself to the hospital.  °  °This information is not intended to replace advice given to you by your health care provider. Make sure you discuss any questions you have with your health care provider. °  °Document Released: 11/01/2005 Document Revised: 03/18/2015 Document Reviewed: 12/31/2011 °Elsevier Interactive Patient Education ©2016 Elsevier Inc. ° °

## 2016-02-24 NOTE — ED Notes (Signed)
Pt reports she was at work when she had a syncopal episode. Reports weakness  all over and headache.Pt lethargic on assessment

## 2016-02-24 NOTE — ED Provider Notes (Addendum)
[facility] Emergency Department Provider Note  ____________________________________________  Time seen: Approximately 5:15 PM  I have reviewed the triage vital signs and the nursing notes.   HISTORY  Chief Complaint Weakness    HPI Annette Garrison is a 40 y.o. female with no significant past medical history other than hypertension, depression, and cocaine abuse with cocaine-induced psychotic disorder who presents after a reported syncopal episode at work.  She reports that she has been very tired recently and felt worn out at work.  She started to feel very hot all over and nauseated and then she passed out.  She tells me that the people at work said that she had some shaking all over.  She was apparently not postictal but complains of severe headache.  She has no focal neurological deficits but complains of feeling weak everywhere.  She has been having some abdominal pain while she eats for a month or more and asked if I could scan her whole body but she is not hurting at this time.  She did not have any vomiting earlier today.   Past Medical History  Diagnosis Date  . Hypertension     Patient Active Problem List   Diagnosis Date Noted  . 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 Dysmenorrhea 08/04/2015  . Hypertension 08/04/2015    Past Surgical History  Procedure Laterality Date  . Cesarean section      Current Outpatient Rx  Name  Route  Sig  Dispense  Refill  . HYDROcodone-acetaminophen (NORCO/VICODIN) 5-325 MG tablet   Oral   Take 1 tablet by mouth every 4 (four) hours as needed for moderate pain.   20 tablet   0     Allergies Review of patient's allergies indicates no known allergies.  History reviewed. No pertinent family history.  Social History Social History  Substance Use Topics  . Smoking status: Current Every Day Smoker -- 2.00 packs/day    Types: Cigarettes  . Smokeless tobacco: None  . Alcohol  Use: No    Review of Systems Constitutional: No fever/chills Eyes: No visual changes. ENT: No sore throat. Cardiovascular: Denies chest pain. Respiratory: Denies shortness of breath. Gastrointestinal: Chronic abdominal pain.  Nausea, no vomiting.  No diarrhea.  No constipation. Genitourinary: Negative for dysuria. Musculoskeletal: Negative for back pain. Skin: Negative for rash. Neurological: Negative for headaches, focal weakness or numbness.  Syncopal episode at work.  10-point ROS otherwise negative.  ____________________________________________   PHYSICAL EXAM:  VITAL SIGNS: ED Triage Vitals  Enc Vitals Group     BP 02/24/16 1506 131/88 mmHg     Pulse Rate 02/24/16 1506 84     Resp 02/24/16 1506 18     Temp 02/24/16 1506 97.9 F (36.6 C)     Temp Source 02/24/16 1506 Oral     SpO2 02/24/16 1506 100 %     Weight 02/24/16 1506 130 lb (58.968 kg)     Height 02/24/16 1506  (1.6 m)     Head Cir --      Peak Flow --      Pain Score 02/24/16 1507 7     Pain Loc --      Pain Edu? --      Excl. in GC? --     Constitutional: Alert and oriented. Well appearing and in no acute distress. Eyes: Conjunctivae are normal. PERRL. EOMI. Head: Atraumatic. Nose: No congestion/rhinnorhea. Mouth/Throat: Mucous membranes are moist.  Oropharynx non-erythematous. Neck: No  stridor.  No meningeal signs.   Cardiovascular: Normal rate, regular rhythm. Good peripheral circulation. Grossly normal heart sounds.   Respiratory: Normal respiratory effort.  No retractions. Lungs CTAB. Gastrointestinal: Soft and nontender. No distention.  Musculoskeletal: No lower extremity tenderness nor edema. No gross deformities of extremities. Neurologic:  Normal speech and language. No gross focal neurologic deficits are appreciated.  Skin:  Skin is warm, dry and intact. No rash noted. Psychiatric: Mood and affect are normal. Speech and behavior are  normal.  ____________________________________________   LABS (all labs ordered are listed, but only abnormal results are displayed)  Labs Reviewed  BASIC METABOLIC PANEL - Abnormal; Notable for the following:    Sodium 133 (*)    Creatinine, Ser 1.08 (*)    Calcium 8.8 (*)    Anion gap 3 (*)    All other components within normal limits  CBC - Abnormal; Notable for the following:    Hemoglobin 7.5 (*)    HCT 25.8 (*)    MCV 59.4 (*)    MCH 17.2 (*)    MCHC 28.9 (*)    RDW 20.3 (*)    All other components within normal limits  URINALYSIS COMPLETEWITH MICROSCOPIC ([facility] ONLY) - Abnormal; Notable for the following:    Color, Urine STRAW (*)    APPearance CLEAR (*)    Squamous Epithelial / LPF 0-5 (*)    All other components within normal limits  URINE DRUG SCREEN, QUALITATIVE ([facility] ONLY) - Abnormal; Notable for the following:    Cocaine Metabolite,Ur Lyman POSITIVE (*)    All other components within normal limits  POC URINE PREG, ED  POCT PREGNANCY, URINE   ____________________________________________  EKG  ED ECG REPORT I, Akbar Sacra, the attending physician, personally viewed and interpreted this ECG.  Date: 02/24/2016 EKG Time: 15:19 Rate: 83 Rhythm: normal sinus rhythm QRS Axis: normal Intervals: normal ST/T Wave abnormalities: normal Conduction Disturbances: none Narrative Interpretation: unremarkable  ____________________________________________  RADIOLOGY   Ct Head Wo Contrast  02/24/2016  CLINICAL DATA:  Syncope with headache EXAM: CT HEAD WITHOUT CONTRAST TECHNIQUE: Contiguous axial images were obtained from the base of the skull through the vertex without intravenous contrast. COMPARISON:  None. FINDINGS: The ventricles are normal in size and configuration. There is no intracranial mass, hemorrhage, extra-axial fluid collection, or midline shift. Gray-white compartments appear normal. No acute infarct evident. Bony calvarium appears intact. The mastoid  air cells are clear. Visualized orbits appear symmetric bilaterally. IMPRESSION: Study within normal limits. Electronically Signed   By: Bretta BangWilliam  Woodruff III M.D.   On: 02/24/2016 18:03    ____________________________________________   PROCEDURES  Procedure(s) performed: None  Critical Care performed: No ____________________________________________   INITIAL IMPRESSION / ASSESSMENT AND PLAN / ED COURSE  Pertinent labs & imaging results that were available during my care of the patient were reviewed by me and considered in my medical decision making (see chart for details).  The patient is generally well-appearing and in no acute distress.  She asked for pain medicine for her headache at the same time is asking for a meal.  What she describes is likely a vasovagal episode and I have given her some fluids.  She is taking good by mouth.  I am going to encourage close outpatient follow-up but there is no indication for additional work up at this time.  Of note, the patient tested positive for cocaine.  I asked her mother to step out of the room and I discussed this finding with  the patient who admitted that she last used cocaine 2 or 3 days ago.  I encouraged her to stop using as this could definitely fact all the rest of the symptoms she is describing.  I asked for her permission to discuss this with her mother, who is here to support her, and she declined to give me permission.  Her mother was present for the rest of the discussions but I did not share any private medical information with her mother for which she did not permit me.  Of note, the patient is significantly anemic today but looking back over her labs she is always anemic.  She has been even lower in the past.  There is no indication for urgent intervention at this time.  She states that she will follow-up with her regular doctor about this issue as well.  ____________________________________________  FINAL CLINICAL  IMPRESSION(S) / ED DIAGNOSES  Final diagnoses:  Vasovagal syncope  Chronic anemia      NEW MEDICATIONS STARTED DURING THIS VISIT:  New Prescriptions   No medications on file      Note:  This document was prepared using Dragon voice recognition software and may include unintentional dictation errors.   Loleta Rose, MD 02/24/16 1845  Loleta Rose, MD 02/24/16 7829

## 2016-02-24 NOTE — ED Notes (Signed)
Pt back from CT

## 2016-02-24 NOTE — ED Notes (Signed)
Pt eating meal at present..Marland Kitchen

## 2016-06-05 ENCOUNTER — Encounter: Payer: Self-pay | Admitting: Urgent Care

## 2016-06-05 ENCOUNTER — Emergency Department
Admission: EM | Admit: 2016-06-05 | Discharge: 2016-06-06 | Disposition: A | Discharge status: Other Acute Inpatient Hospital | Payer: Medicaid Other | Attending: Emergency Medicine | Admitting: Emergency Medicine

## 2016-06-05 DIAGNOSIS — F29 Unspecified psychosis not due to a substance or known physiological condition: Secondary | ICD-10-CM | POA: Diagnosis not present

## 2016-06-05 DIAGNOSIS — I10 Essential (primary) hypertension: Secondary | ICD-10-CM | POA: Insufficient documentation

## 2016-06-05 DIAGNOSIS — Z79899 Other long term (current) drug therapy: Secondary | ICD-10-CM | POA: Insufficient documentation

## 2016-06-05 DIAGNOSIS — Z5181 Encounter for therapeutic drug level monitoring: Secondary | ICD-10-CM | POA: Insufficient documentation

## 2016-06-05 DIAGNOSIS — F22 Delusional disorders: Secondary | ICD-10-CM

## 2016-06-05 DIAGNOSIS — F1410 Cocaine abuse, uncomplicated: Secondary | ICD-10-CM | POA: Diagnosis not present

## 2016-06-05 DIAGNOSIS — R443 Hallucinations, unspecified: Secondary | ICD-10-CM

## 2016-06-05 DIAGNOSIS — F1290 Cannabis use, unspecified, uncomplicated: Secondary | ICD-10-CM | POA: Diagnosis not present

## 2016-06-05 DIAGNOSIS — F23 Brief psychotic disorder: Secondary | ICD-10-CM

## 2016-06-05 DIAGNOSIS — Z046 Encounter for general psychiatric examination, requested by authority: Secondary | ICD-10-CM | POA: Diagnosis present

## 2016-06-05 DIAGNOSIS — F17210 Nicotine dependence, cigarettes, uncomplicated: Secondary | ICD-10-CM | POA: Insufficient documentation

## 2016-06-05 HISTORY — DX: Anemia, unspecified: D64.9

## 2016-06-05 HISTORY — DX: Bipolar disorder, unspecified: F31.9

## 2016-06-05 LAB — SALICYLATE LEVEL

## 2016-06-05 LAB — COMPREHENSIVE METABOLIC PANEL
ALBUMIN: 3.8 g/dL (ref 3.5–5.0)
ALK PHOS: 79 U/L (ref 38–126)
ALT: 11 U/L — ABNORMAL LOW (ref 14–54)
ANION GAP: 9 (ref 5–15)
AST: 18 U/L (ref 15–41)
BUN: 11 mg/dL (ref 6–20)
CHLORIDE: 102 mmol/L (ref 101–111)
CO2: 25 mmol/L (ref 22–32)
Calcium: 8.9 mg/dL (ref 8.9–10.3)
Creatinine, Ser: 1.06 mg/dL — ABNORMAL HIGH (ref 0.44–1.00)
GFR calc non Af Amer: >60 mL/min (ref >60)
GLUCOSE: 114 mg/dL — AB (ref 65–99)
Potassium: 3.3 mmol/L — ABNORMAL LOW (ref 3.5–5.1)
SODIUM: 136 mmol/L (ref 135–145)
Total Bilirubin: 0.4 mg/dL (ref 0.3–1.2)
Total Protein: 8.1 g/dL (ref 6.5–8.1)

## 2016-06-05 LAB — PREGNANCY, URINE: Preg Test, Ur: NEGATIVE

## 2016-06-05 LAB — CBC
HEMATOCRIT: 27.8 % — AB (ref 35.0–47.0)
HEMOGLOBIN: 8.1 g/dL — AB (ref 12.0–15.0)
MCH: 17.8 pg — ABNORMAL LOW (ref 26.0–34.0)
MCHC: 29.2 g/dL — ABNORMAL LOW (ref 32.0–36.0)
MCV: 61.1 fL — ABNORMAL LOW (ref 80.0–100.0)
Platelets: 302 10*3/uL (ref 150–440)
RBC: 4.55 MIL/uL (ref 3.80–5.20)
RDW: 20.4 % — ABNORMAL HIGH (ref 11.5–14.5)
WBC: 7 10*3/uL (ref 3.6–11.0)

## 2016-06-05 LAB — URINE DRUG SCREEN, QUALITATIVE (ARMC ONLY)
AMPHETAMINES, UR SCREEN: NOT DETECTED
BENZODIAZEPINE, UR SCRN: NOT DETECTED
Barbiturates, Ur Screen: NOT DETECTED
COCAINE METABOLITE, UR ~~LOC~~: POSITIVE — AB
Cannabinoid 50 Ng, Ur ~~LOC~~: NOT DETECTED
MDMA (Ecstasy)Ur Screen: NOT DETECTED
METHADONE SCREEN, URINE: NOT DETECTED
OPIATE, UR SCREEN: NOT DETECTED
Phencyclidine (PCP) Ur S: NOT DETECTED
TRICYCLIC, UR SCREEN: NOT DETECTED

## 2016-06-05 LAB — ETHANOL: Alcohol, Ethyl (B): <5 mg/dL (ref <5)

## 2016-06-05 LAB — ACETAMINOPHEN LEVEL

## 2016-06-05 MED ORDER — RISPERIDONE 1 MG PO TABS
1.0000 mg | ORAL_TABLET | ORAL | Status: AC
  Administered 2016-06-05: 1 mg via ORAL
  Filled 2016-06-05: qty 1

## 2016-06-05 NOTE — ED Notes (Signed)
Patient to the ED with having feeling that she is in a spiritual warfair that she has a Ambulance person and snakes in her.  Patient states she has been to a "root" doctor tell her someone put a voodoo curse on her.  Patient states the demon told her that she was pregnant.  Patient does admit history of bipolar and that she has not had her medication (seroquel) and that she last used cocaine approximately 3 days ago.

## 2016-06-05 NOTE — ED Notes (Signed)
Per patient request, notified patient's mother [name] of the password.
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
## 2016-06-05 NOTE — ED Notes (Addendum)
Patient presents for psychiatric evaluation; VOLUNTARY basis at this point. Patient reporting that she is going through a "spiritual warfare" ...reports that 3 different doctors have told her that she was "demonically possessed". Patient states, "There is a demon and many snakes inside of me. I am full of toxins because of it." (+) AH - denies that they are suggesting that she harm herself or others; reports that voices are telling her to get help because there are demons inside. Family reports that patient has blatantly expressed SI and HI - "she tells [place] she wants to kill her children because of the voices". Patient with (+) flight of ideas; (+) repetitive speech. Reports that she is "pregnant by baby Lucifer". Patient is requesting also to be detoxed from Cocaine - last used x 3 days ago.

## 2016-06-05 NOTE — ED Notes (Signed)
MD at bedside. 

## 2016-06-06 ENCOUNTER — Encounter: Payer: Self-pay | Admitting: General Practice

## 2016-06-06 ENCOUNTER — Inpatient Hospital Stay
Admission: RE | Admit: 2016-06-06 | Discharge: 2016-06-08 | DRG: 885 | Disposition: A | Discharge status: Home / Self Care | Payer: Medicaid Other | Source: Intra-hospital | Attending: Psychiatry | Admitting: Psychiatry

## 2016-06-06 DIAGNOSIS — F1410 Cocaine abuse, uncomplicated: Secondary | ICD-10-CM | POA: Diagnosis present

## 2016-06-06 DIAGNOSIS — F17200 Nicotine dependence, unspecified, uncomplicated: Secondary | ICD-10-CM | POA: Diagnosis present

## 2016-06-06 DIAGNOSIS — D649 Anemia, unspecified: Secondary | ICD-10-CM | POA: Diagnosis present

## 2016-06-06 DIAGNOSIS — F1420 Cocaine dependence, uncomplicated: Secondary | ICD-10-CM | POA: Diagnosis present

## 2016-06-06 DIAGNOSIS — F17210 Nicotine dependence, cigarettes, uncomplicated: Secondary | ICD-10-CM | POA: Diagnosis present

## 2016-06-06 DIAGNOSIS — G4700 Insomnia, unspecified: Secondary | ICD-10-CM | POA: Diagnosis present

## 2016-06-06 DIAGNOSIS — Z9119 Patient's noncompliance with other medical treatment and regimen: Secondary | ICD-10-CM

## 2016-06-06 DIAGNOSIS — F312 Bipolar disorder, current episode manic severe with psychotic features: Principal | ICD-10-CM | POA: Diagnosis present

## 2016-06-06 DIAGNOSIS — I10 Essential (primary) hypertension: Secondary | ICD-10-CM | POA: Diagnosis present

## 2016-06-06 DIAGNOSIS — F29 Unspecified psychosis not due to a substance or known physiological condition: Secondary | ICD-10-CM | POA: Diagnosis not present

## 2016-06-06 DIAGNOSIS — E538 Deficiency of other specified B group vitamins: Secondary | ICD-10-CM | POA: Diagnosis present

## 2016-06-06 MED ORDER — ACETAMINOPHEN 325 MG PO TABS: 650.0000 mg | ORAL_TABLET | Freq: Four times a day (QID) | ORAL | Status: DC | PRN

## 2016-06-06 MED ORDER — QUETIAPINE FUMARATE 25 MG PO TABS
ORAL_TABLET | ORAL | Status: AC
  Administered 2016-06-06: 03:00:00
  Filled 2016-06-06: qty 3

## 2016-06-06 MED ORDER — QUETIAPINE FUMARATE 25 MG PO TABS
50.0000 mg | ORAL_TABLET | Freq: Once | ORAL | Status: AC
  Administered 2016-06-06: 50 mg via ORAL
  Filled 2016-06-06: qty 2

## 2016-06-06 MED ORDER — MAGNESIUM HYDROXIDE 400 MG/5ML PO SUSP: 30.0000 mL | Freq: Every day | ORAL | Status: DC | PRN

## 2016-06-06 MED ORDER — ALUM & MAG HYDROXIDE-SIMETH 200-200-20 MG/5ML PO SUSP: 30.0000 mL | ORAL | Status: DC | PRN

## 2016-06-06 NOTE — ED Notes (Signed)
Patient resting quietly in room. No noted distress or abnormal behaviors noted. Will continue 15 minute checks and observation by security camera for safety. 

## 2016-06-06 NOTE — ED Notes (Signed)

## 2016-06-06 NOTE — Progress Notes (Signed)
Patient is to be admitted to [facility] by Dr. Dr. Alvie Heidelberg.  Attending Physician will be Dr. Jennet Maduro.   Patient has been assigned to room 305A, by [facility] Charge Nurse Buras.   Intake Paper Work has been signed and placed on patient chart.  ER staff is aware of the admission [facility].; ER MD;Patient's Nurse Amy Tiburcio Pea & [place] Patient Access) Elsie Lincoln. Sherlon Handing, LPC-A, [facility]  Counselor 06/06/2016 3:44 PM

## 2016-06-06 NOTE — ED Notes (Signed)
Patient to be transferred to [facility] unit for continued treatment. All personal belongings sent with patient.

## 2016-06-06 NOTE — ED Notes (Signed)
Patient asleep in room. No noted distress or abnormal behavior. Will continue 15 minute checks and observation by security cameras for safety. 
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
## 2016-06-06 NOTE — ED Notes (Signed)
Patient awake, ate lunch. Still experiencing hallucinations and delusional thinking. Maintained on 15 minute checks and observation by security camera for safety.
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
## 2016-06-06 NOTE — [facility] Group Notes (Signed)
[facility] Group Notes:  (Nursing/MHT/Case Management/Adjunct)  Date:  06/06/2016  Time:  11:21 PM  Type of Therapy:  Group Therapy  Participation Level:  Active  Participation Quality:  Appropriate  Affect:  Appropriate  Cognitive:  Appropriate  Insight:  Appropriate  Engagement in Group:  Engaged  Modes of Intervention:  n/a  Summary of Progress/Problems:  Annette Garrison 06/06/2016, 11:21 PM
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
## 2016-06-06 NOTE — ED Provider Notes (Signed)
-----------------------------------------   8:09 AM on 06/06/2016 -----------------------------------------   Blood pressure (!) 103/58, pulse 76, temperature 98 F (36.7 C), temperature source Oral, resp. rate 18, height 5\' 3"  (1.6 m), weight 130 lb (59 kg), SpO2 97 %.  The patient had no acute events since last update.  Calm and cooperative at this time.  Disposition is pending per Psychiatry/Behavioral Medicine team recommendations.     Jeanmarie Plant, MD 06/06/16 [phone]

## 2016-06-06 NOTE — ED Notes (Signed)
Patient awake, alert, and oriented. She is still experiencing auditory hallucinations, specifically of the devil. Still feels the "root" is in her body. She discussed some of the practitioners she saw to eliminate the root but they were too expensive for her. Maintained on 15 minute checks for safety.

## 2016-06-07 ENCOUNTER — Encounter: Payer: Self-pay | Admitting: Psychiatry

## 2016-06-07 DIAGNOSIS — F17200 Nicotine dependence, unspecified, uncomplicated: Secondary | ICD-10-CM | POA: Diagnosis present

## 2016-06-07 DIAGNOSIS — F1420 Cocaine dependence, uncomplicated: Secondary | ICD-10-CM | POA: Diagnosis present

## 2016-06-07 DIAGNOSIS — F312 Bipolar disorder, current episode manic severe with psychotic features: Principal | ICD-10-CM

## 2016-06-07 DIAGNOSIS — D649 Anemia, unspecified: Secondary | ICD-10-CM | POA: Diagnosis present

## 2016-06-07 MED ORDER — IBUPROFEN 600 MG PO TABS
600.0000 mg | ORAL_TABLET | Freq: Four times a day (QID) | ORAL | Status: DC | PRN
  Administered 2016-06-07 – 2016-06-08 (×3): 600 mg via ORAL
  Filled 2016-06-07 (×3): qty 1

## 2016-06-07 MED ORDER — QUETIAPINE FUMARATE 100 MG PO TABS
100.0000 mg | ORAL_TABLET | Freq: Every day | ORAL | Status: DC
  Administered 2016-06-07: 100 mg via ORAL
  Filled 2016-06-07: qty 1

## 2016-06-07 MED ORDER — PHENAZOPYRIDINE HCL 100 MG PO TABS
100.0000 mg | ORAL_TABLET | Freq: Once | ORAL | Status: AC
  Administered 2016-06-07: 100 mg via ORAL
  Filled 2016-06-07: qty 1

## 2016-06-07 NOTE — [facility] Group Notes (Signed)
[facility] LCSW Group Therapy   06/07/2016 9:30 am Type of Therapy: Group Therapy   Participation Level: Active   Participation Quality: Attentive, Sharing and Supportive   Affect: Depressed and Flat   Cognitive: Alert and Oriented   Insight: Developing/Improving and Engaged   Engagement in Therapy: Developing/Improving and Engaged   Modes of Intervention: Clarification, Confrontation, Discussion, Education, Exploration,  Limit-setting, Orientation, Problem-solving, Rapport Building, Dance movement psychotherapist, Socialization and Support   Summary of Progress/Problems: Pt identified obstacles faced currently and processed barriers involved in overcoming these obstacles. Pt identified steps necessary for overcoming these obstacles and explored motivation (internal and external) for facing these difficulties head on. Pt further identified one area of concern in their lives and chose a goal to focus on for today. Pt has good insight on diagnosis and aftercare plans. Patient identified drugs as an obstacle she has to overcome. She stated that her motivation is her children and her presence in their lives. Pt stated that she believes more intense treatment could improve her symptoms.  Hampton Abbot, LCSWA
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
## 2016-06-07 NOTE — [facility] Group Notes (Signed)
[facility] Group Notes:  (Nursing/MHT/Case Management/Adjunct)  Date:  06/07/2016  Time:  11:20 PM  Type of Therapy:  Evening Wrap-up Group  Participation Level:  Active  Participation Quality:  Appropriate, Attentive and Supportive  Affect:  Appropriate  Cognitive:  Alert and Appropriate  Insight:  Appropriate and Good  Engagement in Group:  Engaged  Modes of Intervention:  Activity  Summary of Progress/Problems: Patient very active in outside activity sharing and supporting peers.  [name] 06/07/2016, 11:20 PM

## 2016-06-07 NOTE — Plan of Care (Signed)
Problem: Health Behavior/Discharge Planning: Goal: Ability to manage health-related needs will improve Outcome: Progressing Patient able to maintain adls
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
## 2016-06-08 DIAGNOSIS — E538 Deficiency of other specified B group vitamins: Secondary | ICD-10-CM | POA: Diagnosis present

## 2016-06-08 LAB — FOLATE: FOLATE: 5.5 ng/mL — AB (ref >5.9)

## 2016-06-08 LAB — HEMOGLOBIN A1C

## 2016-06-08 LAB — LIPID PANEL
Cholesterol: 157 mg/dL (ref 0–200)
HDL: 33 mg/dL — ABNORMAL LOW (ref >40)
LDL CALC: 93 mg/dL (ref 0–99)
TRIGLYCERIDES: 157 mg/dL — AB (ref <150)
Total CHOL/HDL Ratio: 4.8 RATIO
VLDL: 31 mg/dL (ref 0–40)

## 2016-06-08 LAB — VITAMIN B12: Vitamin B-12: 250 pg/mL (ref 180–914)

## 2016-06-08 LAB — TSH: TSH: 1.429 u[IU]/mL (ref 0.350–4.500)

## 2016-06-08 MED ORDER — VITAMIN B-12 1000 MCG PO TABS
1000.0000 ug | ORAL_TABLET | Freq: Every day | ORAL | Status: DC
  Administered 2016-06-08: 1000 ug via ORAL
  Filled 2016-06-08: qty 1

## 2016-06-08 MED ORDER — CYANOCOBALAMIN 1000 MCG/ML IJ SOLN
1000.0000 ug | Freq: Once | INTRAMUSCULAR | Status: AC
  Administered 2016-06-08: 1000 ug via INTRAMUSCULAR
  Filled 2016-06-08: qty 1

## 2016-06-08 MED ORDER — QUETIAPINE FUMARATE 100 MG PO TABS: 100.0000 mg | ORAL_TABLET | Freq: Every day | ORAL | 0 refills | Status: DC

## 2016-06-08 MED ORDER — CYANOCOBALAMIN 1000 MCG PO TABS: 1000.0000 ug | ORAL_TABLET | Freq: Every day | ORAL | 3 refills | Status: DC

## 2016-06-08 NOTE — BHH Suicide Risk Assessment (Signed)
[facility] Discharge Suicide Risk Assessment   Principal Problem: Bipolar I disorder, current or most recent episode manic, with psychotic features [facility] [place] [facility]) Discharge Diagnoses:  Patient Active Problem List   Diagnosis Date Noted  . Cocaine use disorder, moderate, dependence (HCC) [F14.20] 06/07/2016  . Tobacco use disorder [F17.200] 06/07/2016  . Anemia [D64.9] 06/07/2016  . Bipolar I disorder, current or most recent episode manic, with psychotic features (HCC) [F31.2] 06/06/2016  . 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 Dysmenorrhea [N94.6] 08/04/2015  . Hypertension [I10] 08/04/2015    Total Time spent with patient: 30 minutes  Musculoskeletal: Strength & Muscle Tone: within normal limits Gait & Station: normal Patient leans: N/A  Psychiatric Specialty Exam: Review of Systems  Psychiatric/Behavioral: Positive for substance abuse.  All other systems reviewed and are negative.   Blood pressure 127/87, pulse 88, temperature 97.8 F (36.6 C), temperature source Oral, resp. rate 18, height 5\' 2"  (1.575 m), weight 63 kg (139 lb), SpO2 99 %.Body mass index is 25.42 kg/m.  General Appearance: Fairly Groomed  Patent attorney::  Good  Speech:  Clear and Coherent409  Volume:  Normal  Mood:  Euthymic  Affect:  Appropriate  Thought Process:  Goal Directed  Orientation:  Full (Time, Place, and Person)  Thought Content:  Delusions and Paranoid Ideation  Suicidal Thoughts:  No  Homicidal Thoughts:  No  Memory:  Immediate;   Fair Recent;   Fair Remote;   Fair  Judgement:  Impaired  Insight:  Shallow  Psychomotor Activity:  Normal  Concentration:  Fair  Recall:  [employer] of Knowledge:Fair  Language: Fair  Akathisia:  No  Handed:  Right  AIMS (if indicated):     Assets:  Communication Skills Desire for Improvement Financial Resources/Insurance Housing Physical Health Resilience Social Support  Sleep:  Number of Hours: 7.5  Cognition: WNL   ADL's:  Intact   Mental Status Per Nursing Assessment::   On Admission:     Demographic Factors:  Low socioeconomic status and Unemployed  Loss Factors: NA  Historical Factors: Impulsivity  Risk Reduction Factors:   Responsible for children under 82 years of age, Sense of responsibility to family, Living with another person, especially a relative, Positive social support and Positive therapeutic relationship  Continued Clinical Symptoms:  Bipolar Disorder:   Mixed State Alcohol/Substance Abuse/Dependencies  Cognitive Features That Contribute To Risk:  None    Suicide Risk:  Minimal: No identifiable suicidal ideation.  Patients presenting with no risk factors but with morbid ruminations; may be classified as minimal risk based on the severity of the depressive symptoms    Plan Of Care/Follow-up recommendations:  Activity:  as tolerated. Diet:  low sodium heart healthy. Other:  keep follow up appointments.  Kristine Linea, MD 06/08/2016, 9:13 AM

## 2016-06-08 NOTE — Progress Notes (Signed)
D: Patient still appears delusional. Denies SI/HI but does endores AH. States the voices are telling her she has a snack in her. She did attend wrap-up group and has been visible in the milieu.  A: Medication given with education. Encouragement provided.  R: Patient was compliant with medication. She has remained calm and cooperative. Safety maintained with 15 min checks.

## 2016-06-08 NOTE — Progress Notes (Signed)
Patient with appropriate affect, cooperative behavior with meals, meds and plan of care. No SI/HI/AVH at this time. Patient takes prn Motrin this am for c/o menstral cramping with good effect. Tired and does not attend am therapy group. Patient to meet with MD and social worker to discuss possible discharge today. Safety maintained.
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
## 2016-06-08 NOTE — [facility] Suicide Risk Assessment (Signed)
[facility] INPATIENT:  Family/Significant Other Suicide Prevention Education  Suicide Prevention Education:  Patient Refusal for Family/Significant Other Suicide Prevention Education: The patient Annette Garrison has refused to provide written consent for family/significant other to be provided Family/Significant Other Suicide Prevention Education during admission and/or prior to discharge.  Physician notified.  Glennon Mac, MSW, LCSW 06/08/2016, 4:57 PM

## 2016-06-08 NOTE — Progress Notes (Signed)
Discharged at this time with aunt arriving to transport patient in private car. Verbalizes understanding rt recommended discharge plan of care. Acknowledges all belongings have been returned. Safety maintained.

## 2016-06-08 NOTE — Plan of Care (Signed)
Problem: Safety: Goal: Ability to remain free from injury will improve Outcome: Progressing Patient has remained free during this admission.

## 2016-06-08 NOTE — Progress Notes (Signed)
  [facility] Adult Case Management Discharge Plan :  Will you be returning to the same living situation after discharge:  Yes,    At discharge, do you have transportation home?: Yes,    Do you have the ability to pay for your medications: Yes,     Release of information consent forms completed and in the chart;  Patient's signature needed at discharge.  Patient to Follow up at: Follow-up Information    [facility]. Go today.   Why:  Hospital Follow up, [facility] Monday-Friday between 9am-4pm.  Attend Regularly scheduled SAIOP meetings. Contact information: [address] [place] [address] [phone] FAX           Next level of care provider has access to [facility] Link:no  Safety Planning and Suicide Prevention discussed: Yes,     Have you used any form of tobacco in the last 30 days? (Cigarettes, Smokeless Tobacco, Cigars, and/or Pipes): Yes  Has patient been referred to the Quitline?: Patient refused referral  Patient has been referred for addiction treatment: Yes  Marda Breidenbach, Cleda Daub, MSW, LCSW 06/08/2016, 4:57 PM

## 2016-06-08 NOTE — Tx Team (Signed)
Interdisciplinary Treatment Plan Update (Adult)  Date:  06/08/2016 Time Reviewed:  4:58 PM  Progress in Treatment: Attending groups: Yes. Participating in groups:  Yes. Taking medication as prescribed:  Yes. Tolerating medication:  Yes. Family/Significant othe contact made:  No, will contact:  Pt refused Patient understands diagnosis:  Yes. Discussing patient identified problems/goals with staff:  Yes. Medical problems stabilized or resolved:  Yes. Denies suicidal/homicidal ideation: Yes. Issues/concerns per patient self-inventory:  No. Other:  New problem(s) identified: No, Describe:     Discharge Plan or Barriers:Pt discharging home today with mother and follow up medication management and  SAIOP program at [facility] in [place]  Reason for Continuation of Hospitalization: None  Comments:Pt admitted for psychosis secondary to substance use.  She is much improved, psychosis has resolved.  Estimated length of stay:0 days  New goal(s):  Review of initial/current patient goals per problem list:   1.  Goal(s): Patient will participate in aftercare plan * Met: YES * Target date: at discharge * As evidenced by: Patient will participate within aftercare plan AEB aftercare provider and housing plan at discharge being identified.   2.  Goal (s): Patient will exhibit decreased depressive symptoms and suicidal ideations. * Met: YES *  Target date: at discharge * As evidenced by: Patient will utilize self-rating of depression at 3 or below and demonstrate decreased signs of depression or be deemed stable for discharge by MD. 3.  Goal (s): Patient will demonstrate decreased symptoms of psychosis. * Met: YES *  Target date: at discharge As evidenced by: Patient will not endorse signs of psychosis or be deemed stable for discharge by MD  Attendees: Patient:  Annette Garrison 7/25/20174:58 PM  Family:   7/25/20174:58 PM  Physician:  Orson Slick 7/25/20174:58 PM   Nursing:   Polly Cobia, RN 7/25/20174:58 PM  Case Manager:   7/25/20174:58 PM  Counselor:  Dossie Arbour, LCSW 7/25/20174:58 PM  Other:   7/25/20174:58 PM  Other:   7/25/20174:58 PM  Other:   7/25/20174:58 PM  Other:  7/25/20174:58 PM  Other:  7/25/20174:58 PM  Other:  7/25/20174:58 PM  Other:  7/25/20174:58 PM  Other:  7/25/20174:58 PM  Other:  7/25/20174:58 PM  Other:   7/25/20174:58 PM   Scribe for Treatment Team:   August Saucer, 06/08/2016, 4:58 PM, MSW, LCSW

## 2016-06-09 LAB — MISC LABCORP TEST (SEND OUT): Labcorp test code: 1453

## 2016-06-09 LAB — PROLACTIN: Prolactin: 24.4 ng/mL — ABNORMAL HIGH (ref 4.8–23.3)

## 2016-11-17 ENCOUNTER — Emergency Department
Admission: EM | Admit: 2016-11-17 | Discharge: 2016-11-17 | Disposition: A | Discharge status: Home / Self Care | Payer: Medicaid Other | Attending: Emergency Medicine | Admitting: Emergency Medicine

## 2016-11-17 ENCOUNTER — Emergency Department: Payer: Medicaid Other

## 2016-11-17 DIAGNOSIS — F17210 Nicotine dependence, cigarettes, uncomplicated: Secondary | ICD-10-CM | POA: Insufficient documentation

## 2016-11-17 DIAGNOSIS — A5901 Trichomonal vulvovaginitis: Secondary | ICD-10-CM | POA: Insufficient documentation

## 2016-11-17 DIAGNOSIS — B9689 Other specified bacterial agents as the cause of diseases classified elsewhere: Secondary | ICD-10-CM

## 2016-11-17 DIAGNOSIS — A599 Trichomoniasis, unspecified: Secondary | ICD-10-CM

## 2016-11-17 DIAGNOSIS — N760 Acute vaginitis: Secondary | ICD-10-CM

## 2016-11-17 DIAGNOSIS — I10 Essential (primary) hypertension: Secondary | ICD-10-CM | POA: Insufficient documentation

## 2016-11-17 DIAGNOSIS — R079 Chest pain, unspecified: Secondary | ICD-10-CM | POA: Insufficient documentation

## 2016-11-17 LAB — CBC
HCT: 28.2 % — ABNORMAL LOW (ref 35.0–47.0)
HEMOGLOBIN: 8.3 g/dL — AB (ref 12.0–16.0)
MCH: 17.4 pg — ABNORMAL LOW (ref 26.0–34.0)
MCHC: 29.5 g/dL — AB (ref 32.0–36.0)
MCV: 59 fL — ABNORMAL LOW (ref 80.0–100.0)
Platelets: 356 10*3/uL (ref 150–440)
RBC: 4.78 MIL/uL (ref 3.80–5.20)
RDW: 20.1 % — AB (ref 11.5–14.5)
WBC: 4.8 10*3/uL (ref 3.6–11.0)

## 2016-11-17 LAB — URINALYSIS, COMPLETE (UACMP) WITH MICROSCOPIC
Bacteria, UA: NONE SEEN
Bilirubin Urine: NEGATIVE
Glucose, UA: NEGATIVE mg/dL
Hgb urine dipstick: NEGATIVE
Ketones, ur: NEGATIVE mg/dL
Leukocytes, UA: NEGATIVE
Nitrite: NEGATIVE
PH: 6 (ref 5.0–8.0)
Protein, ur: 30 mg/dL — AB
SPECIFIC GRAVITY, URINE: 1.024 (ref 1.005–1.030)

## 2016-11-17 LAB — WET PREP, GENITAL
SPERM: NONE SEEN
Yeast Wet Prep HPF POC: NONE SEEN

## 2016-11-17 LAB — BASIC METABOLIC PANEL
ANION GAP: 6 (ref 5–15)
BUN: 10 mg/dL (ref 6–20)
CALCIUM: 9.1 mg/dL (ref 8.9–10.3)
CO2: 26 mmol/L (ref 22–32)
Chloride: 106 mmol/L (ref 101–111)
Creatinine, Ser: 0.93 mg/dL (ref 0.44–1.00)
GLUCOSE: 91 mg/dL (ref 65–99)
Potassium: 3.9 mmol/L (ref 3.5–5.1)
SODIUM: 138 mmol/L (ref 135–145)

## 2016-11-17 LAB — TROPONIN I

## 2016-11-17 LAB — HEPATIC FUNCTION PANEL
ALT: 13 U/L — AB (ref 14–54)
AST: 24 U/L (ref 15–41)
Albumin: 3.9 g/dL (ref 3.5–5.0)
Alkaline Phosphatase: 71 U/L (ref 38–126)
Bilirubin, Direct: <0.1 mg/dL — ABNORMAL LOW (ref 0.1–0.5)
TOTAL PROTEIN: 8.4 g/dL — AB (ref 6.5–8.1)
Total Bilirubin: 0.5 mg/dL (ref 0.3–1.2)

## 2016-11-17 LAB — LIPASE, BLOOD: LIPASE: 26 U/L (ref 11–51)

## 2016-11-17 LAB — CHLAMYDIA/NGC RT PCR (ARMC ONLY)
CHLAMYDIA TR: NOT DETECTED
N GONORRHOEAE: NOT DETECTED

## 2016-11-17 LAB — POCT PREGNANCY, URINE: PREG TEST UR: NEGATIVE

## 2016-11-17 MED ORDER — METRONIDAZOLE 500 MG PO TABS: 500.0000 mg | ORAL_TABLET | Freq: Two times a day (BID) | ORAL | 0 refills | Status: DC

## 2016-11-17 MED ORDER — GI COCKTAIL ~~LOC~~
30.0000 mL | ORAL | Status: AC
  Administered 2016-11-17: 30 mL via ORAL
  Filled 2016-11-17: qty 30

## 2016-11-17 MED ORDER — FAMOTIDINE 20 MG PO TABS
40.0000 mg | ORAL_TABLET | Freq: Once | ORAL | Status: AC
  Administered 2016-11-17: 40 mg via ORAL
  Filled 2016-11-17: qty 2

## 2016-11-17 NOTE — ED Provider Notes (Signed)
[facility] Emergency Department Provider Note  ____________________________________________  Time seen: Approximately 12:31 PM  I have reviewed the triage vital signs and the nursing notes.   HISTORY  Chief Complaint Chest Pain    HPI Annette Garrison is a 41 y.o. female who complains of chest pain since 2 AM today. No shortness of breath diaphoresis vomiting. No radiation. Feels achy. Not pleuritic, not exertional. It feels like burning in the center of the chest. Similar to acid reflux she's had in the past.  She also requests that she be checked for sexual transmitted infections that she's noted a vaginal discharge recently. Denies any pain or bleeding. Has low suspicion for gonorrhea or chlamydia. No dysuria frequency urgency.       Past Medical History:  Diagnosis Date  . Anemia   . Bipolar disorder (HCC)   . Hypertension      Patient Active Problem List   Diagnosis Date Noted  . Vitamin B12 deficiency 06/08/2016  . Cocaine use disorder, moderate, dependence (HCC) 06/07/2016  . Tobacco use disorder 06/07/2016  . Anemia 06/07/2016  . Bipolar I disorder, current or most recent episode manic, with psychotic features (HCC) 06/06/2016  . 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 Dysmenorrhea 08/04/2015  . Hypertension 08/04/2015     Past Surgical History:  Procedure Laterality Date  . CESAREAN SECTION       Prior to Admission medications   Medication Sig Start Date End Date Taking? Authorizing Provider  metroNIDAZOLE (FLAGYL) 500 MG tablet Take 1 tablet (500 mg total) by mouth 2 (two) times daily. 11/17/16   Sharman CheekPhillip Madisin Hasan, MD  QUEtiapine (SEROQUEL) 100 MG tablet Take 1 tablet (100 mg total) by mouth at bedtime. Patient not taking: Reported on 11/17/2016 06/08/16   Shari ProwsJolanta B Pucilowska, MD  vitamin B-12 1000 MCG tablet Take 1 tablet (1,000 mcg total) by mouth daily. Patient not taking: Reported on 11/17/2016  06/08/16   Shari ProwsJolanta B Pucilowska, MD     Allergies Patient has no known allergies.   No family history on file.  Social History Social History  Substance Use Topics  . Smoking status: Current Some Day Smoker    Packs/day: 2.00    Types: Cigarettes  . Smokeless tobacco: Never Used  . Alcohol use No    Review of Systems  Constitutional:   No fever or chills.  ENT:   No sore throat. No rhinorrhea. Cardiovascular:   Positive as above chest pain. Respiratory:   No dyspnea or cough. Gastrointestinal:   Negative for abdominal pain, vomiting and diarrhea.  Genitourinary:   Negative for dysuria or difficulty urinating. Positive vaginal discharge Musculoskeletal:   Negative for focal pain or swelling Neurological:   Negative for headaches 10-point ROS otherwise negative.  ____________________________________________   PHYSICAL EXAM:  VITAL SIGNS: ED Triage Vitals [11/17/16 0855]  Enc Vitals Group     BP 127/82     Pulse Rate 92     Resp 17     Temp 97.9 F (36.6 C)     Temp Source Oral     SpO2 100 %     Weight 132 lb (59.9 kg)     Height 5\' 3"  (1.6 m)     Head Circumference      Peak Flow      Pain Score 6     Pain Loc      Pain Edu?      Excl. in GC?  Vital signs reviewed, nursing assessments reviewed.   Constitutional:   Alert and oriented. Well appearing and in no distress. Eyes:   No scleral icterus. No conjunctival pallor. PERRL. EOMI.  No nystagmus. ENT   Head:   Normocephalic and atraumatic.   Nose:   No congestion/rhinnorhea. No septal hematoma   Mouth/Throat:   MMM, no pharyngeal erythema. No peritonsillar mass.    Neck:   No stridor. No SubQ emphysema. No meningismus. Hematological/Lymphatic/Immunilogical:   No cervical lymphadenopathy. Cardiovascular:   RRR. Symmetric bilateral radial and DP pulses.  No murmurs.  Respiratory:   Normal respiratory effort without tachypnea nor retractions. Breath sounds are clear and equal bilaterally.  No wheezes/rales/rhonchi.Chest wall nontender Gastrointestinal:   Soft with left upper quadrant tenderness. Non distended. There is no CVA tenderness.  No rebound, rigidity, or guarding. Genitourinary:   Pelvic exam performed with tech Paulette at bedside. External exam unremarkable. Speculum exam reveals thin grayish discharge in the vault. No CMT or adnexal tenderness or masses. Uterine palpation on bimanual exam is unremarkable. Musculoskeletal:   Nontender with normal range of motion in all extremities. No joint effusions.  No lower extremity tenderness.  No edema. Neurologic:   Normal speech and language.  CN 2-10 normal. Motor grossly intact. No gross focal neurologic deficits are appreciated.  Skin:    Skin is warm, dry and intact. No rash noted.  No petechiae, purpura, or bullae.  ____________________________________________    LABS (pertinent positives/negatives) (all labs ordered are listed, but only abnormal results are displayed) Labs Reviewed  WET PREP, GENITAL - Abnormal; Notable for the following:       Result Value   Trich, Wet Prep PRESENT (*)    Clue Cells Wet Prep HPF POC PRESENT (*)    WBC, Wet Prep HPF POC FEW (*)    All other components within normal limits  CBC - Abnormal; Notable for the following:    Hemoglobin 8.3 (*)    HCT 28.2 (*)    MCV 59.0 (*)    MCH 17.4 (*)    MCHC 29.5 (*)    RDW 20.1 (*)    All other components within normal limits  HEPATIC FUNCTION PANEL - Abnormal; Notable for the following:    Total Protein 8.4 (*)    ALT 13 (*)    Bilirubin, Direct <0.1 (*)    All other components within normal limits  URINALYSIS, COMPLETE (UACMP) WITH MICROSCOPIC - Abnormal; Notable for the following:    Color, Urine YELLOW (*)    APPearance CLEAR (*)    Protein, ur 30 (*)    Squamous Epithelial / LPF 0-5 (*)    All other components within normal limits  CHLAMYDIA/NGC RT PCR ([facility] ONLY)  BASIC METABOLIC PANEL  TROPONIN I  LIPASE, BLOOD   POC URINE PREG, ED  POCT PREGNANCY, URINE   ____________________________________________   EKG  Interpreted by me Normal sinus rhythm rate of 92, normal axis intervals QRS ST segments and T waves  ____________________________________________    RADIOLOGY    ____________________________________________   PROCEDURES Procedures  ____________________________________________   INITIAL IMPRESSION / ASSESSMENT AND PLAN / ED COURSE  Pertinent labs & imaging results that were available during my care of the patient were reviewed by me and considered in my medical decision making (see chart for details).  Patient well appearing no acute distress, presents with 2 complaints. First is chest pain which is nonspecific and likely due to gastritis and GERD. Patient given GI cocktail and antacids  for this. We'll continue her on famotidine. No further workup indicated at this time. Second complaint is vaginal discharge. Pelvic exam and wet prep consistent with Trichomonas and bacterial vaginosis. We'll treat with a week of metronidazole. Low suspicion for other acute pelvic pathology. GC chlamydia testing negative.     Clinical Course    ____________________________________________   FINAL CLINICAL IMPRESSION(S) / ED DIAGNOSES  Final diagnoses:  Trichomoniasis  Bacterial vaginosis  Nonspecific chest pain      New Prescriptions   METRONIDAZOLE (FLAGYL) 500 MG TABLET    Take 1 tablet (500 mg total) by mouth 2 (two) times daily.     Portions of this note were generated with dragon dictation software. Dictation errors may occur despite best attempts at proofreading.    Sharman Cheek, MD 11/17/16 [phone]

## 2016-11-17 NOTE — ED Triage Notes (Signed)
Pt c/o chest pain since 2am .. Denies N/V/SOB

## 2016-11-17 NOTE — ED Notes (Signed)
Patient has taken off all monitoring leads. Educated on need for monitoring. Patient verbalizes understanding.

## 2017-03-03 IMAGING — CT CT HEAD W/O CM
1 series · 16 of 30 positions shown, 20 images · non-contrast
Comparison: None.

CLINICAL DATA: Syncope with headache

EXAM:
CT HEAD WITHOUT CONTRAST
TECHNIQUE: Contiguous axial images were obtained from the base of the skull
through the vertex without intravenous contrast.

[Series 2: head wo · axial · 0.42mm/px · z∈[+218,+348]mm · 16 of 30 slices shown, 20 images]
[im 2/30  brain]
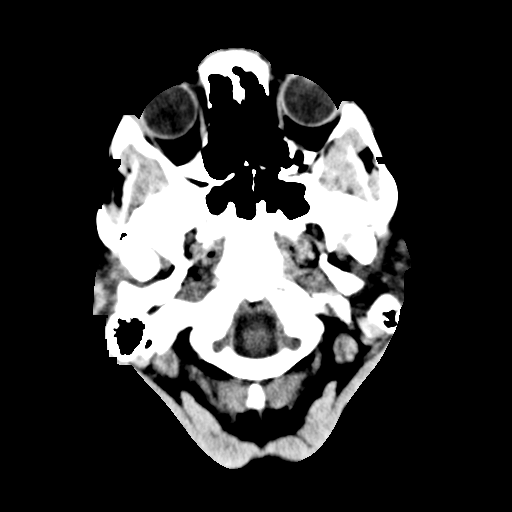
[im 2/30  bone]
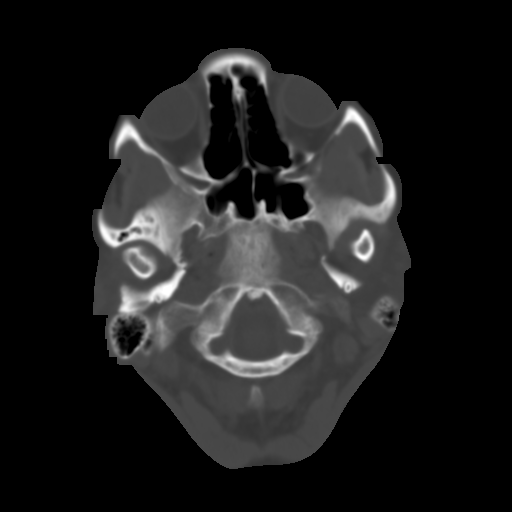
[im 4/30  brain]
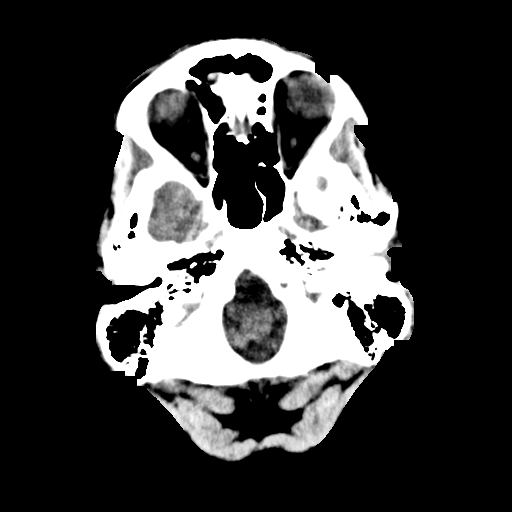
[im 6/30  brain]
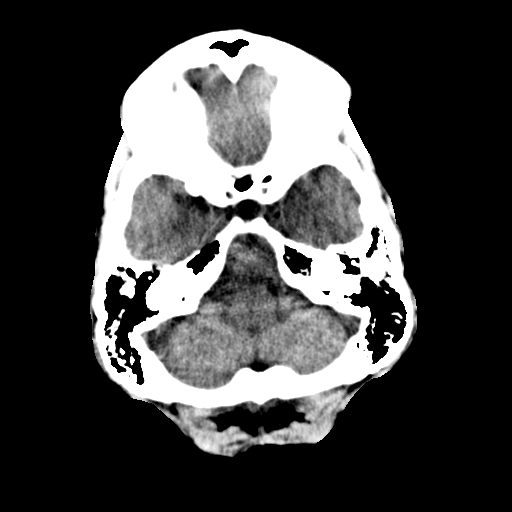
[im 8/30  brain]
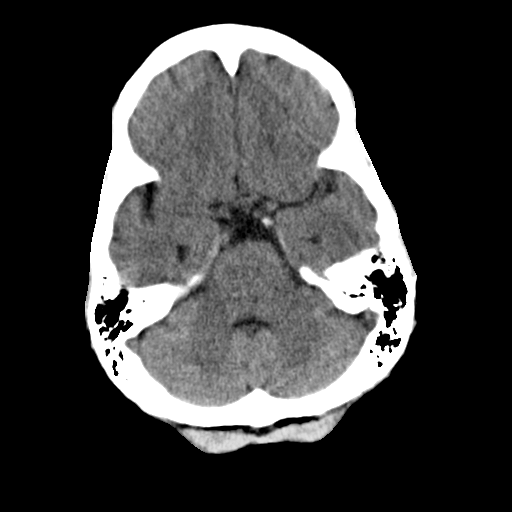
[im 9/30  brain]
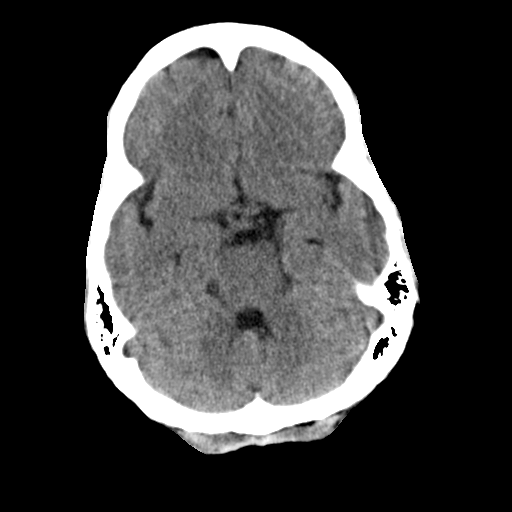
[im 9/30  bone]
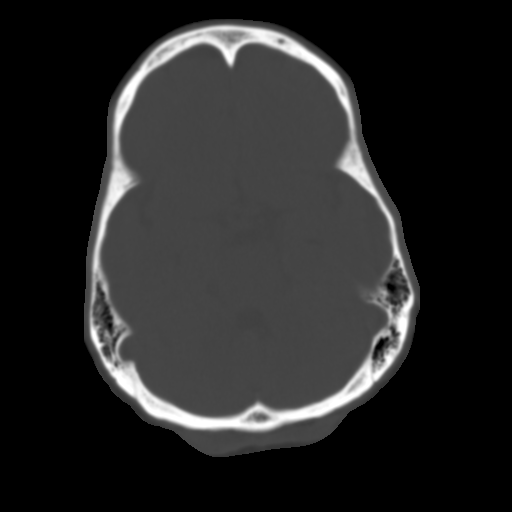
[im 11/30  brain]
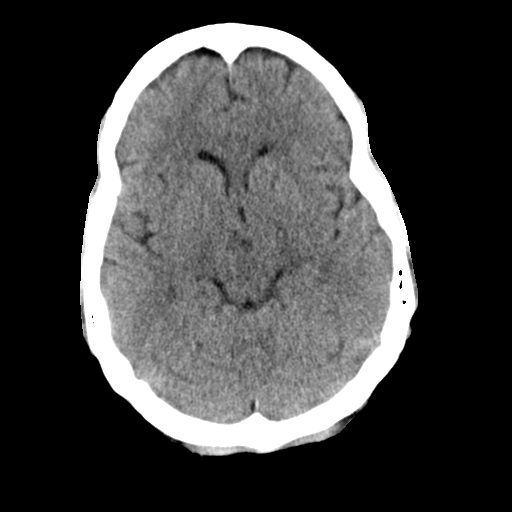
[im 13/30  brain]
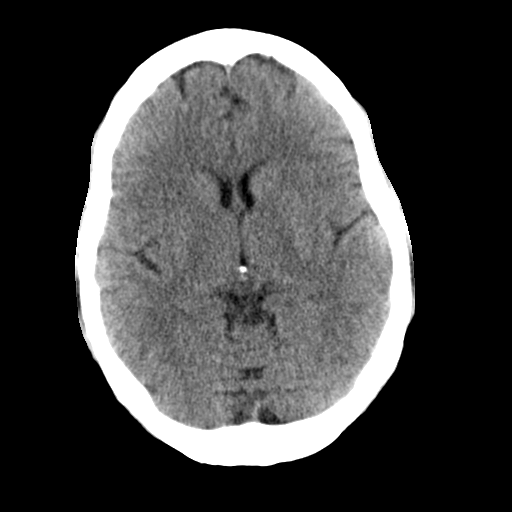
[im 15/30  brain]
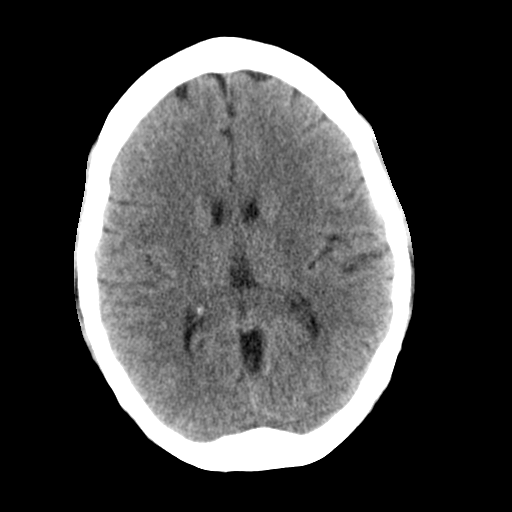
[im 16/30  brain]
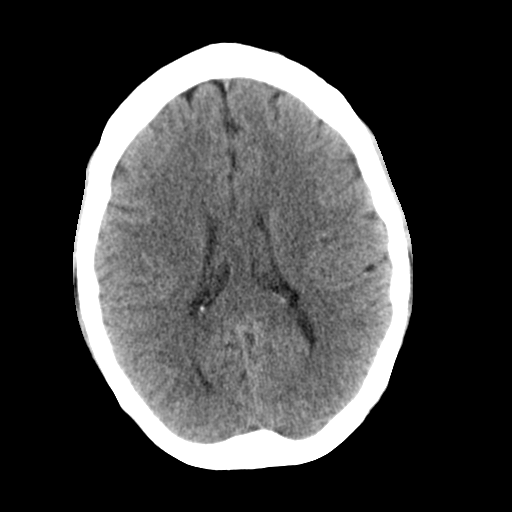
[im 16/30  bone]
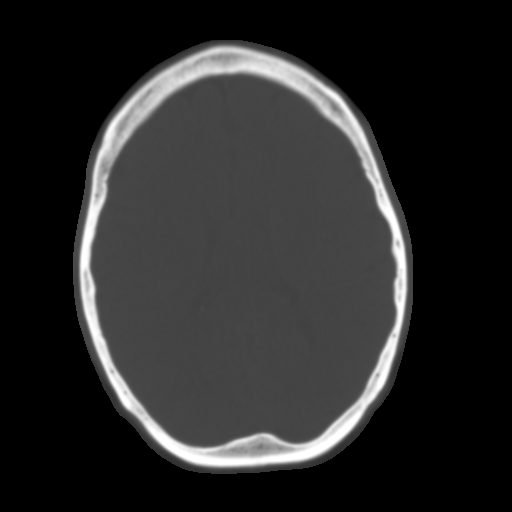
[im 18/30  brain]
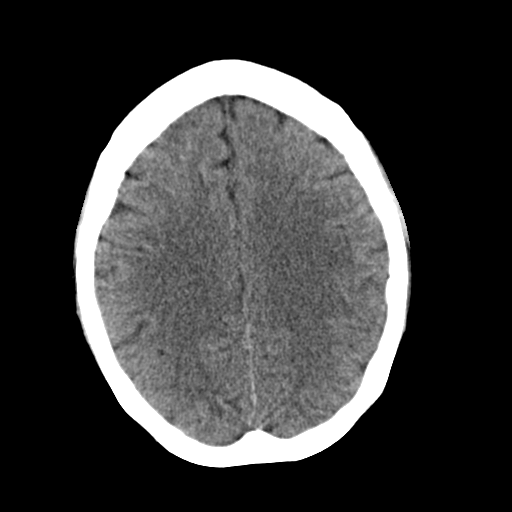
[im 20/30  brain]
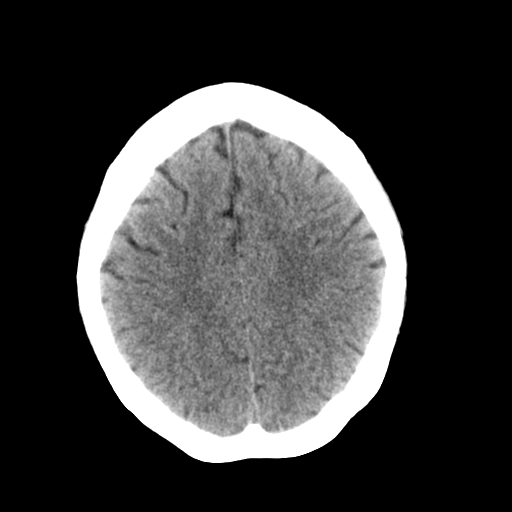
[im 22/30  brain]
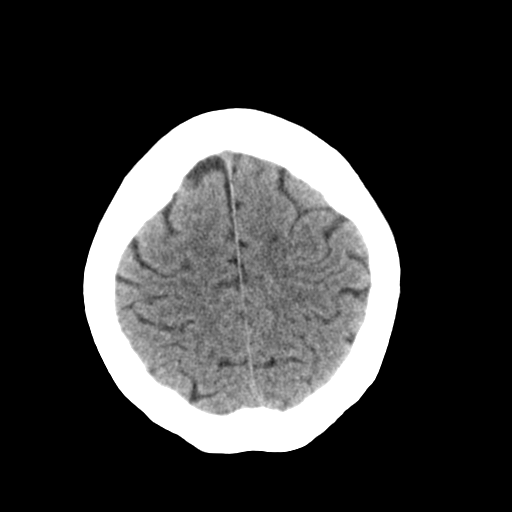
[im 23/30  brain]
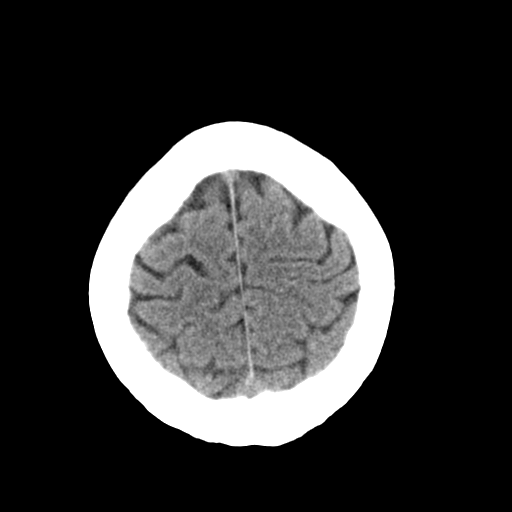
[im 23/30  bone]
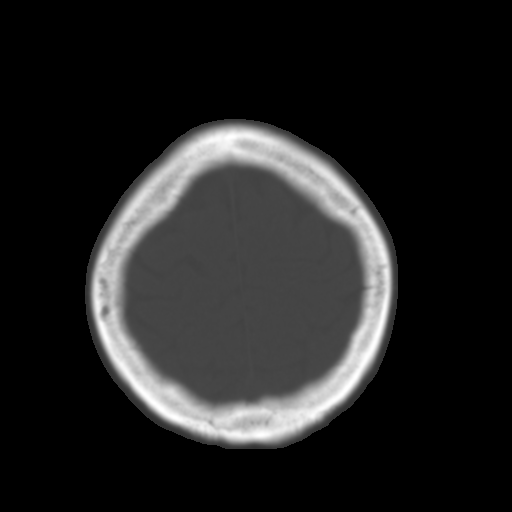
[im 25/30  brain]
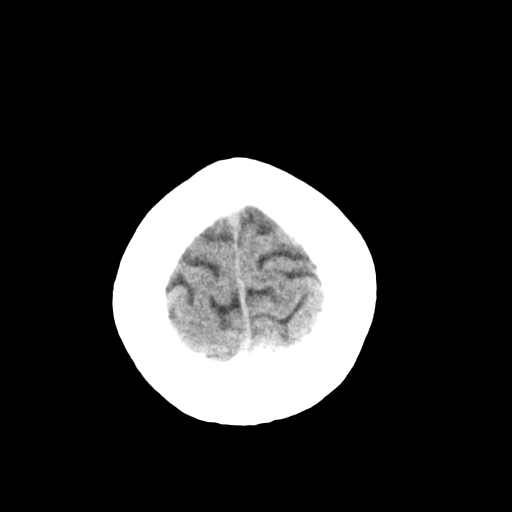
[im 27/30  brain]
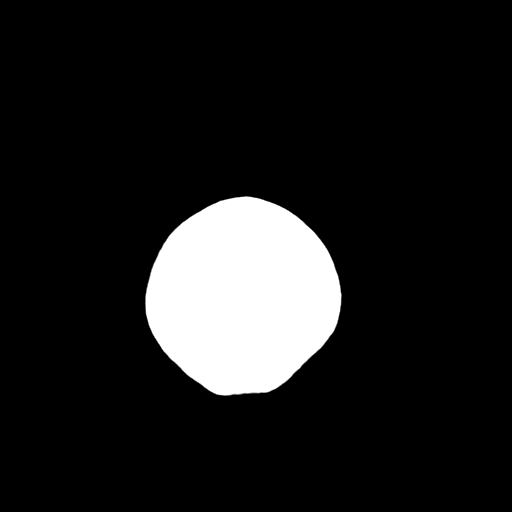
[im 29/30  brain]
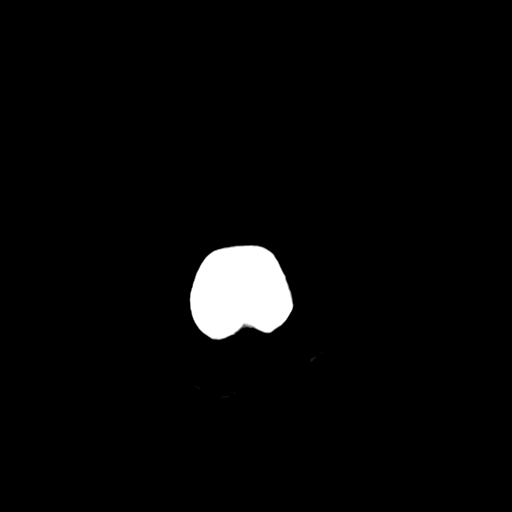

[16 of 30 positions shown; findings below may reference images not displayed]

FINDINGS: The ventricles are normal in size and configuration. There is no
intracranial mass, hemorrhage, extra-axial fluid collection, or
midline shift. Gray-white compartments appear normal. No acute
infarct evident. Bony calvarium appears intact. The mastoid air
cells are clear. Visualized orbits appear symmetric bilaterally.
IMPRESSION: Study within normal limits.

## 2018-01-12 ENCOUNTER — Other Ambulatory Visit: Payer: Self-pay

## 2018-01-12 ENCOUNTER — Encounter: Payer: Self-pay | Admitting: *Deleted

## 2018-01-12 ENCOUNTER — Emergency Department
Admission: EM | Admit: 2018-01-12 | Discharge: 2018-01-14 | Disposition: A | Discharge status: Home / Self Care | Payer: Self-pay | Attending: Emergency Medicine | Admitting: Emergency Medicine

## 2018-01-12 DIAGNOSIS — D649 Anemia, unspecified: Secondary | ICD-10-CM | POA: Diagnosis present

## 2018-01-12 DIAGNOSIS — F1410 Cocaine abuse, uncomplicated: Secondary | ICD-10-CM | POA: Diagnosis present

## 2018-01-12 DIAGNOSIS — F1910 Other psychoactive substance abuse, uncomplicated: Secondary | ICD-10-CM | POA: Insufficient documentation

## 2018-01-12 DIAGNOSIS — F319 Bipolar disorder, unspecified: Secondary | ICD-10-CM | POA: Insufficient documentation

## 2018-01-12 DIAGNOSIS — R443 Hallucinations, unspecified: Secondary | ICD-10-CM

## 2018-01-12 DIAGNOSIS — I10 Essential (primary) hypertension: Secondary | ICD-10-CM | POA: Insufficient documentation

## 2018-01-12 DIAGNOSIS — F312 Bipolar disorder, current episode manic severe with psychotic features: Secondary | ICD-10-CM | POA: Diagnosis present

## 2018-01-12 DIAGNOSIS — F17210 Nicotine dependence, cigarettes, uncomplicated: Secondary | ICD-10-CM | POA: Insufficient documentation

## 2018-01-12 LAB — POCT PREGNANCY, URINE: PREG TEST UR: NEGATIVE

## 2018-01-12 LAB — CBC
HCT: 29.9 % — ABNORMAL LOW (ref 35.0–47.0)
Hemoglobin: 9.1 g/dL — ABNORMAL LOW (ref 12.0–16.0)
MCH: 19.7 pg — AB (ref 26.0–34.0)
MCHC: 30.6 g/dL — ABNORMAL LOW (ref 32.0–36.0)
MCV: 64.5 fL — AB (ref 80.0–100.0)
PLATELETS: 323 10*3/uL (ref 150–440)
RBC: 4.63 MIL/uL (ref 3.80–5.20)
RDW: 19.7 % — AB (ref 11.5–14.5)
WBC: 8.2 10*3/uL (ref 3.6–11.0)

## 2018-01-12 LAB — URINE DRUG SCREEN, QUALITATIVE (ARMC ONLY)
Amphetamines, Ur Screen: NOT DETECTED
BENZODIAZEPINE, UR SCRN: NOT DETECTED
Barbiturates, Ur Screen: NOT DETECTED
CANNABINOID 50 NG, UR ~~LOC~~: POSITIVE — AB
Cocaine Metabolite,Ur ~~LOC~~: POSITIVE — AB
MDMA (ECSTASY) UR SCREEN: NOT DETECTED
Methadone Scn, Ur: NOT DETECTED
OPIATE, UR SCREEN: NOT DETECTED
PHENCYCLIDINE (PCP) UR S: NOT DETECTED
Tricyclic, Ur Screen: NOT DETECTED

## 2018-01-12 LAB — COMPREHENSIVE METABOLIC PANEL
ALBUMIN: 4.2 g/dL (ref 3.5–5.0)
ALK PHOS: 72 U/L (ref 38–126)
ALT: 15 U/L (ref 14–54)
AST: 26 U/L (ref 15–41)
Anion gap: 7 (ref 5–15)
BUN: 6 mg/dL (ref 6–20)
CALCIUM: 8.8 mg/dL — AB (ref 8.9–10.3)
CO2: 21 mmol/L — AB (ref 22–32)
CREATININE: 0.75 mg/dL (ref 0.44–1.00)
Chloride: 106 mmol/L (ref 101–111)
GFR calc Af Amer: >60 mL/min (ref >60)
GFR calc non Af Amer: >60 mL/min (ref >60)
Glucose, Bld: 96 mg/dL (ref 65–99)
Potassium: 3.6 mmol/L (ref 3.5–5.1)
SODIUM: 134 mmol/L — AB (ref 135–145)
Total Bilirubin: 0.5 mg/dL (ref 0.3–1.2)
Total Protein: 8.4 g/dL — ABNORMAL HIGH (ref 6.5–8.1)

## 2018-01-12 LAB — ETHANOL: Alcohol, Ethyl (B): <10 mg/dL (ref <10)

## 2018-01-12 MED ORDER — IBUPROFEN 600 MG PO TABS
600.0000 mg | ORAL_TABLET | Freq: Once | ORAL | Status: AC
  Administered 2018-01-12: 600 mg via ORAL
  Filled 2018-01-12: qty 1

## 2018-01-12 NOTE — ED Provider Notes (Signed)
[facility] Emergency Department Provider Note   ____________________________________________   I have reviewed the triage vital signs and the nursing notes.   HISTORY  Chief Complaint Headache and Behavioral Evaluation   History limited by: Not Limited   HPI Annette Garrison is a 42 y.o. female who presents to the emergency department today under IVC because of concerns for abnormal behavior.  Patient states that she has been having hallucinations.  She states this been going on for a couple of weeks.  She has both seen and cured things.  She describes them as spirits.  They have not been telling her to harm herself or others.  In addition she has been having a headache.  She describes it as severe.  It is all throughout her head.  She does admit to using cocaine in an attempt to help the headache.  Per medical record review patient has a history of HTN, bipolar.   Past Medical History:  Diagnosis Date  . Anemia   . Bipolar disorder (HCC)   . Hypertension     Patient Active Problem List   Diagnosis Date Noted  . Vitamin B12 deficiency 06/08/2016  . Cocaine use disorder, moderate, dependence (HCC) 06/07/2016  . Tobacco use disorder 06/07/2016  . Anemia 06/07/2016  . Bipolar I disorder, current or most recent episode manic, with psychotic features (HCC) 06/06/2016  . 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 Dysmenorrhea 08/04/2015  . Hypertension 08/04/2015    Past Surgical History:  Procedure Laterality Date  . CESAREAN SECTION      Prior to Admission medications   Medication Sig Start Date End Date Taking? Authorizing Provider  metroNIDAZOLE (FLAGYL) 500 MG tablet Take 1 tablet (500 mg total) by mouth 2 (two) times daily. 11/17/16   Sharman Cheek, MD  QUEtiapine (SEROQUEL) 100 MG tablet Take 1 tablet (100 mg total) by mouth at bedtime. Patient not taking: Reported on 11/17/2016 06/08/16   Pucilowska, Ellin Goodie, MD   vitamin B-12 1000 MCG tablet Take 1 tablet (1,000 mcg total) by mouth daily. Patient not taking: Reported on 11/17/2016 06/08/16   Shari Prows, MD    Allergies Patient has no known allergies.  History reviewed. No pertinent family history.  Social History Social History   Tobacco Use  . Smoking status: Current Some Day Smoker    Packs/day: 2.00    Types: Cigarettes  . Smokeless tobacco: Never Used  Substance Use Topics  . Alcohol use: No  . Drug use: Yes    Types: Marijuana, Cocaine    Review of Systems Constitutional: No fever/chills Eyes: No visual changes. ENT: No sore throat. Cardiovascular: Denies chest pain. Respiratory: Denies shortness of breath. Gastrointestinal: No abdominal pain.  No nausea, no vomiting.  No diarrhea.   Genitourinary: Negative for dysuria. Musculoskeletal: Negative for back pain. Skin: Negative for rash. Neurological: Positive for headache. ____________________________________________   PHYSICAL EXAM:  VITAL SIGNS: ED Triage Vitals  Enc Vitals Group     BP 01/12/18 1823 (!) 145/93     Pulse Rate 01/12/18 1823 96     Resp 01/12/18 1823 20     Temp 01/12/18 1823 98.7 F (37.1 C)     Temp Source 01/12/18 1823 Oral     SpO2 01/12/18 1823 100 %     Weight 01/12/18 1825 130 lb (59 kg)     Height 01/12/18 1825 5\' 3"  (1.6 m)     Head Circumference --  Peak Flow --      Pain Score 01/12/18 1839 10   Constitutional: Alert and oriented. Well appearing and in no distress. Eyes: Conjunctivae are normal.  ENT   Head: Normocephalic and atraumatic.   Nose: No congestion/rhinnorhea.   Mouth/Throat: Mucous membranes are moist.   Neck: No stridor. Hematological/Lymphatic/Immunilogical: No cervical lymphadenopathy. Cardiovascular: Normal rate, regular rhythm.  No murmurs, rubs, or gallops.  Respiratory: Normal respiratory effort without tachypnea nor retractions. Breath sounds are clear and equal bilaterally. No  wheezes/rales/rhonchi. Gastrointestinal: Soft and non tender. No rebound. No guarding.  Genitourinary: Deferred Musculoskeletal: Normal range of motion in all extremities. No lower extremity edema. Neurologic:  Normal speech and language. No gross focal neurologic deficits are appreciated.  Skin:  Skin is warm, dry and intact. No rash noted. Psychiatric: Denies SI/HI. Not obviously responding to internal stimuli  ____________________________________________    LABS (pertinent positives/negatives)  Ethanol <10 POCT urine negative CBC wbc 8.2, hgb 9.1, plt 323 CMP na 134, cr 0.75  ____________________________________________   EKG  None  ____________________________________________    RADIOLOGY  None  ____________________________________________   PROCEDURES  Procedures  ____________________________________________   INITIAL IMPRESSION / ASSESSMENT AND PLAN / ED COURSE  Pertinent labs & imaging results that were available during my care of the patient were reviewed by me and considered in my medical decision making (see chart for details).  Patient presented to the emergency department today under IVC because of concerns for substance abuse and abnormal behavior.  On exam patient denies any SI and does not appear to be responding to internal stimuli.  She does however endorse some paranoia.  Will have specialist on call evaluate.  _______________________________________   FINAL CLINICAL IMPRESSION(S) / ED DIAGNOSES  Final diagnoses:  Substance abuse (HCC)  Hallucinations     Note: This dictation was prepared with Dragon dictation. Any transcriptional errors that result from this process are unintentional     Phineas SemenGoodman, Oluwadarasimi Favor, MD 01/12/18 2302

## 2018-01-12 NOTE — ED Notes (Signed)
Report to SOC  

## 2018-01-12 NOTE — ED Notes (Signed)
SOC in progress.  

## 2018-01-12 NOTE — ED Notes (Addendum)
Error in charting.

## 2018-01-12 NOTE — ED Notes (Signed)
Snack tray given to patient.  

## 2018-01-12 NOTE — ED Notes (Signed)
Patient placed in triage holding area with [employer] officer until triage can be completed. Patient in NAD at this time.

## 2018-01-12 NOTE — ED Notes (Signed)
Pt. Alert and oriented, warm and dry, in no distress. Pt. Denies SI, HI, and AH. Pt states, "I am seeing my uncles hot rod, its just a spirit that I see." patient states her headache is a lot better. Pain level is 3 out of 10. Pt. Encouraged to let nursing staff know of any concerns or needs.

## 2018-01-12 NOTE — ED Notes (Signed)
[facility]

## 2018-01-12 NOTE — ED Notes (Signed)
Pt in room 20, removed 2 rings from pts person.  One yellow colored ring with green stone.  One silvered colored ring with blue stone.  Rings placed in a cup, labelled and placed in pts belongings.

## 2018-01-12 NOTE — ED Notes (Signed)
Patient complaining of headache pain 8-9 out of 10. EDP made aware. Order ibuprofen 600mg  po given.

## 2018-01-12 NOTE — ED Triage Notes (Signed)
Pt to ED under IVC after family reported chronic drug use that has changed into pt now talking to people that are not in the room and laughing at night when family reports she is alone. When RN entered triage room pt was looking into the corner of the room and talking to someone that was not there. Pt does confirm that she has visual and auditory hallucinations that she reports feel like an evil spirit. Pt reports they threaten her and tell her to kill herself. Pt reports, "I have Jesus though and I do not want to kill myself. When asked if she has thoughts of killing others pt began crying but did not answer the question.   Pt reports last drug use was a week ago and reports only using cocaine. No alcohol use.   Pt also report headache 10/100 pain for the past week with changes in vision.

## 2018-01-12 NOTE — ED Notes (Signed)
SOC complete.  

## 2018-01-12 NOTE — ED Notes (Signed)
Pending soc

## 2018-01-13 DIAGNOSIS — F312 Bipolar disorder, current episode manic severe with psychotic features: Secondary | ICD-10-CM

## 2018-01-13 MED ORDER — LORAZEPAM 2 MG/ML IJ SOLN
2.0000 mg | INTRAMUSCULAR | Status: DC | PRN
  Administered 2018-01-13: 2 mg via INTRAVENOUS

## 2018-01-13 MED ORDER — IBUPROFEN 400 MG PO TABS
400.0000 mg | ORAL_TABLET | Freq: Four times a day (QID) | ORAL | Status: DC | PRN
  Administered 2018-01-13: 400 mg via ORAL
  Filled 2018-01-13: qty 1

## 2018-01-13 MED ORDER — LORAZEPAM 2 MG/ML IJ SOLN: 2.0000 mg | Freq: Once | INTRAMUSCULAR | Status: DC

## 2018-01-13 MED ORDER — IBUPROFEN 600 MG PO TABS
600.0000 mg | ORAL_TABLET | Freq: Four times a day (QID) | ORAL | Status: DC | PRN
  Administered 2018-01-13: 600 mg via ORAL

## 2018-01-13 MED ORDER — IBUPROFEN 600 MG PO TABS
ORAL_TABLET | ORAL | Status: AC
  Filled 2018-01-13: qty 1

## 2018-01-13 MED ORDER — HALOPERIDOL LACTATE 5 MG/ML IJ SOLN: 5.0000 mg | Freq: Four times a day (QID) | INTRAMUSCULAR | Status: DC | PRN

## 2018-01-13 MED ORDER — HALOPERIDOL LACTATE 5 MG/ML IJ SOLN: 5.0000 mg | Freq: Once | INTRAMUSCULAR | Status: DC

## 2018-01-13 MED ORDER — LORAZEPAM 2 MG/ML IJ SOLN
INTRAMUSCULAR | Status: AC
  Administered 2018-01-13: 2 mg via INTRAVENOUS
  Filled 2018-01-13: qty 1

## 2018-01-13 MED ORDER — QUETIAPINE FUMARATE 100 MG PO TABS
100.0000 mg | ORAL_TABLET | Freq: Every day | ORAL | Status: DC
  Administered 2018-01-13: 100 mg via ORAL
  Filled 2018-01-13: qty 4
  Filled 2018-01-13: qty 1

## 2018-01-13 NOTE — ED Notes (Signed)

## 2018-01-13 NOTE — ED Provider Notes (Signed)
-----------------------------------------   12:32 AM on 01/13/2018 -----------------------------------------  [facility] Waldron SessionGerz who recommends admission to inpatient psychiatry service.   Irean HongSung, Verina Galeno J, MD 01/13/18 [phone]

## 2018-01-13 NOTE — ED Notes (Signed)
Pt. Requested a cup of ice, pt. Given cup of ice

## 2018-01-13 NOTE — ED Notes (Signed)
Pt. Came to nursing station request tissue, pt. Given box of tissues.

## 2018-01-13 NOTE — BH Assessment (Signed)
Pt denied by [facility] due to no insurance. Will continue to wait for bed assignment on [facility].

## 2018-01-13 NOTE — ED Provider Notes (Signed)
-----------------------------------------   1:37 PM on 01/13/2018 -----------------------------------------  Patient has been seen by psychiatry, they will be admitting to their service once a bed becomes available.   Minna AntisPaduchowski, Hennessey Cantrell, MD 01/13/18 [phone]

## 2018-01-13 NOTE — BH Assessment (Signed)
Assessment Note  Annette Garrison is an 42 y.o. female. IVC'd due to reports of AH/ VH. Pt's family reported that pt uses drugs frequently and feels it has caused her  to see and hear people that are not present. Pt reports to cocaine use, but denies any other drug use. At time of assessment pt reported to no longer seeing figures but did state she has AH. Pt reported to previously seeing her deceased uncle's spirit. When pt described AH she reported to hearing voices of uncle and past boyfriend. Pt could not recall exact words that she hears but stated that most words or phrases that she hears are "comical". Pt endorsed no SI/HI   Diagnosis: Bipolar Disorder  Past Medical History:  Past Medical History:  Diagnosis Date  . Anemia   . Bipolar disorder (HCC)   . Hypertension     Past Surgical History:  Procedure Laterality Date  . CESAREAN SECTION      Family History: History reviewed. No pertinent family history.  Social History:  reports that she has been smoking cigarettes.  She has been smoking about 2.00 packs per day. she has never used smokeless tobacco. She reports that she uses drugs. Drugs: Marijuana and Cocaine. She reports that she does not drink alcohol.  Additional Social History:  Alcohol / Drug Use Pain Medications: see mar Prescriptions: see mar Over the Counter: see mar History of alcohol / drug use?: Yes Longest period of sobriety (when/how long): unknown Negative Consequences of Use: Personal relationships, Financial Substance #1 Name of Substance 1: Cocaine 1 - Age of First Use: 36 1 - Amount (size/oz): 20 bag 1 - Frequency: varies 1 - Duration: varies 1 - Last Use / Amount: yesterday Substance #2 Name of Substance 2: marijuana 2 - Age of First Use: pt denied use, labs positive 2 - Amount (size/oz): unknown 2 - Frequency: unknown 2 - Duration: unknown 2 - Last Use / Amount: unknown  CIWA: CIWA-Ar BP: (!) 121/93 Pulse Rate: (!) 106 COWS:    Allergies: No  Known Allergies  Home Medications:  (Not in a hospital admission)  OB/GYN Status:  Patient's last menstrual period was 11/30/2017.  General Assessment Data Assessment unable to be completed: (assessment completed) Location of Assessment: [facility] ED TTS Assessment: In system Is this a Tele or Face-to-Face Assessment?: Face-to-Face Is this an Initial Assessment or a Re-assessment for this encounter?: Initial Assessment Marital status: Married [place] name: Annette Garrison Is patient pregnant?: No Pregnancy Status: No Living Arrangements: Other relatives(Pt reports to living with aunt and aunt's sister) Can pt return to current living arrangement?: Yes Admission Status: Involuntary Is patient capable of signing voluntary admission?: No Referral Source: Self/Family/Friend Insurance type: None  Medical Screening Exam [facility] Walk-in ONLY) Medical Exam completed: Yes  Crisis Care Plan Living Arrangements: Other relatives(Pt reports to living with aunt and aunt's sister) Legal Guardian: (none reported) Name of Psychiatrist: None reported Name of Therapist: None reported  Education Status Is patient currently in school?: No Current Grade: Not in school Highest grade of school patient has completed: 50 Name of school: Unknown Contact person: None reported  Risk to self with the past 6 months Suicidal Ideation: No-Not Currently/Within Last 6 Months Has patient been a risk to self within the past 6 months prior to admission? : No Suicidal Intent: No-Not Currently/Within Last 6 Months Has patient had any suicidal intent within the past 6 months prior to admission? : No Is patient at risk for suicide?: No, but patient  needs Medical Clearance Suicidal Plan?: No-Not Currently/Within Last 6 Months Has patient had any suicidal plan within the past 6 months prior to admission? : No Access to Means: No What has been your use of drugs/alcohol within the last 12 months?: (Pt reports cocaine use  frequently) Previous Attempts/Gestures: No How many times?: (N/A) Other Self Harm Risks: None reported Triggers for Past Attempts: (None reported) Intentional Self Injurious Behavior: None Family Suicide History: Unknown Recent stressful life event(s): Turmoil (Comment)(Pt reports that aunt ) Persecutory voices/beliefs?: No Depression: Yes Depression Symptoms: Isolating Substance abuse history and/or treatment for substance abuse?: Yes Suicide prevention information given to non-admitted patients: Not applicable  Risk to Others within the past 6 months Homicidal Ideation: No-Not Currently/Within Last 6 Months Does patient have any lifetime risk of violence toward others beyond the six months prior to admission? : No Thoughts of Harm to Others: Yes-Currently Present(Pt reports to having thoughts of harming ppl from her past. ) Comment - Thoughts of Harm to Others: Pt reports to having thoughts, no method or means Current Homicidal Intent: No-Not Currently/Within Last 6 Months Current Homicidal Plan: No-Not Currently/Within Last 6 Months Access to Homicidal Means: No Identified Victim: (None reported) History of harm to others?: No Assessment of Violence: On admission Violent Behavior Description: None reported, only thoughts- no means or identified person Does patient have access to weapons?: No Criminal Charges Pending?: No Does patient have a court date: No Is patient on probation?: Unknown  Psychosis Hallucinations: Auditory, Visual(Pt reports to auditory hallucinations; no longer visual) Delusions: None noted  Mental Status Report Appearance/Hygiene: Disheveled, Bizarre Eye Contact: Poor Motor Activity: Restlessness Speech: Rapid, Pressured Level of Consciousness: Restless, Sleeping Mood: Preoccupied, Depressed, Apprehensive Affect: Apprehensive, Preoccupied Anxiety Level: Minimal Thought Processes: Irrelevant Judgement: Partial Orientation: Person, Time, Place,  Situation, Appropriate for developmental age Obsessive Compulsive Thoughts/Behaviors: None  Cognitive Functioning Concentration: Decreased Memory: Remote Intact IQ: Average Insight: Fair Impulse Control: Fair Appetite: Good Weight Loss: 0 Weight Gain: 10 Sleep: Decreased Total Hours of Sleep: 4 Vegetative Symptoms: None(None reported)  ADLScreening [facility] Assessment Services) Patient's cognitive ability adequate to safely complete daily activities?: Yes Patient able to express need for assistance with ADLs?: Yes Independently performs ADLs?: Yes (appropriate for developmental age)  Prior Inpatient Therapy Prior Inpatient Therapy: (None reported) Prior Therapy Dates: (None reported) Prior Therapy Facilty/Provider(s): (None reported) Reason for Treatment: (None reported)  Prior Outpatient Therapy Prior Outpatient Therapy: (None reported) Prior Therapy Dates: (None reported) Prior Therapy Facilty/Provider(s): (None reported) Reason for Treatment: (None reported) Does patient have an ACCT team?: No Does patient have Intensive In-House Services?  : No Does patient have [facility] services? : No Does patient have P4CC services?: No  ADL Screening (condition at time of admission) Patient's cognitive ability adequate to safely complete daily activities?: Yes Is the patient deaf or have difficulty hearing?: No Does the patient have difficulty seeing, even when wearing glasses/contacts?: No Does the patient have difficulty concentrating, remembering, or making decisions?: No Patient able to express need for assistance with ADLs?: Yes Does the patient have difficulty dressing or bathing?: No Independently performs ADLs?: Yes (appropriate for developmental age) Does the patient have difficulty walking or climbing stairs?: No Weakness of Legs: None Weakness of Arms/Hands: None  Home Assistive Devices/Equipment Home Assistive Devices/Equipment: None  Therapy Consults (therapy  consults require a physician order) PT Evaluation Needed: No OT Evalulation Needed: No SLP Evaluation Needed: No Abuse/Neglect Assessment (Assessment to be complete while patient is alone) Abuse/Neglect Assessment Can Be Completed:  Unable to assess, patient is non-responsive or altered mental status Values / Beliefs Cultural Requests During Hospitalization: None Spiritual Requests During Hospitalization: None Consults Spiritual Care Consult Needed: No Social Work Consult Needed: No Merchant navy officerAdvance Directives (For Healthcare) Does Patient Have a Medical Advance Directive?: No Would patient like information on creating a medical advance directive?: No - Patient declined    Additional Information 1:1 In Past 12 Months?: No CIRT Risk: No Elopement Risk: No Does patient have medical clearance?: Yes     Disposition:  Disposition Initial Assessment Completed for this Encounter: Yes Disposition of Patient: Inpatient treatment program Type of inpatient treatment program: Adult  On Site Evaluation by:   Reviewed with Physician:    Lattie HawJerrica  Reham Slabaugh 01/13/2018 1:05 PM

## 2018-01-13 NOTE — ED Notes (Signed)
IVC/SOC completed/ Consult by Dr.Clapacs/ [facility]

## 2018-01-13 NOTE — Progress Notes (Signed)
Pt asleep at present. Respirations noted and unlabored. Pt denied SI, HI, AVH and pain when assessed "not right now". Pt presents guarded and minimal on interactions. Cooperative with care and unit routines. Medication compliant when offered. Denied adverse drug reactions. Tolerated all PO intake well when offered. Emotional support and encouragement provided to pt. Safety checks maintained at Q 15 minutes intervals without self harm gestures or outburst to note thus far.

## 2018-01-13 NOTE — Consult Note (Signed)
[facility] Consult   Reason for Consult: Consult for 42 year old woman with history of cocaine abuse and psychotic disorder brought in by family because of worsening psychosis and behavior Referring Physician: Archie Balboa Patient Identification: Annette Garrison MRN:  [id number] Principal Diagnosis: Bipolar I disorder, current or most recent episode manic, with psychotic features [facility]) Diagnosis:   Patient Active Problem List   Diagnosis Date Noted  . Vitamin B12 deficiency [E53.8] 06/08/2016  . Cocaine use disorder, moderate, dependence ([place]) [F14.20] 06/07/2016  . Tobacco use disorder [F17.200] 06/07/2016  . Anemia [D64.9] 06/07/2016  . Bipolar I disorder, current or most recent episode manic, with psychotic features ([place]) [F31.2] 06/06/2016  . Cocaine abuse with cocaine-induced psychotic disorder ([place]) [F14.159] 08/04/2015  . Dysmenorrhea [N94.6] 08/04/2015  . Hypertension [I10] 08/04/2015    Total Time spent with patient: 1 hour  Subjective:   Annette Garrison is a 42 y.o. female patient admitted with "I am hearing things".  HPI: Patient interviewed chart reviewed.  42 year old woman came to the emergency room yesterday evening brought by family with reports of more frequent hallucinations agitation behavior problems.  Patient admits that she has been using cocaine.  She is vague about how frequently she is doing it but her drug screen is positive for cocaine and for cannabis.  Denies that she has been drinking.  Not currently on any medicine or getting any outpatient psychiatric follow-up.  Patient is still talking about hearing voices although she is sleepier today and less clear-cut than she was last night when it was documented that she was hyper religious and talking about religious hallucinations.  Appears to be not taking care of herself well.  No report of any suicidal or homicidal ideation.  Social history: Patient says she lives with family.  She does not give me any other  details about who all lives at home.  Family are clearly concerned and brought her into the hospital.  Medical history: Chronic anemia.  No other known medical problems possible hypertension probably related to drug use as well.  Substance abuse history: History of cocaine abuse that has been a problem in the past.  She was last seen here a couple years ago not sure if she has been using continuously or if this is an intermittent thing.  Past Psychiatric History: Patient has been diagnosed with both bipolar disorder and cocaine induced psychosis in the past.  Has presented to our hospital at least twice previously with psychotic symptoms but was also using drugs at the time.  Has had periods documented of being maintained on modest dose of Seroquel.  No known history of suicide.  Risk to Self: Suicidal Ideation: No-Not Currently/Within Last 6 Months Suicidal Intent: No-Not Currently/Within Last 6 Months Is patient at risk for suicide?: No, but patient needs Medical Clearance Suicidal Plan?: No-Not Currently/Within Last 6 Months Access to Means: No What has been your use of drugs/alcohol within the last 12 months?: (Pt reports cocaine use frequently) How many times?: (N/A) Other Self Harm Risks: None reported Triggers for Past Attempts: (None reported) Intentional Self Injurious Behavior: None Risk to Others: Homicidal Ideation: No-Not Currently/Within Last 6 Months Thoughts of Harm to Others: Yes-Currently Present(Pt reports to having thoughts of harming ppl from her past. ) Comment - Thoughts of Harm to Others: Pt reports to having thoughts, no method or means Current Homicidal Intent: No-Not Currently/Within Last 6 Months Current Homicidal Plan: No-Not Currently/Within Last 6 Months Access to Homicidal Means: No Identified Victim: (None  reported) History of harm to others?: No Assessment of Violence: On admission Violent Behavior Description: None reported, only thoughts- no means or  identified person Does patient have access to weapons?: No Criminal Charges Pending?: No Does patient have a court date: No Prior Inpatient Therapy: Prior Inpatient Therapy: (None reported) Prior Therapy Dates: (None reported) Prior Therapy Facilty/Provider(s): (None reported) Reason for Treatment: (None reported) Prior Outpatient Therapy: Prior Outpatient Therapy: (None reported) Prior Therapy Dates: (None reported) Prior Therapy Facilty/Provider(s): (None reported) Reason for Treatment: (None reported) Does patient have an ACCT team?: No Does patient have Intensive In-House Services?  : No Does patient have [facility] services? : No Does patient have P4CC services?: No  Past Medical History:  Past Medical History:  Diagnosis Date  . Anemia   . Bipolar disorder ([place])   . Hypertension     Past Surgical History:  Procedure Laterality Date  . CESAREAN SECTION     Family History: History reviewed. No pertinent family history. Family Psychiatric  History: None known Social History:  Social History   Substance and Sexual Activity  Alcohol Use No     Social History   Substance and Sexual Activity  Drug Use Yes  . Types: Marijuana, Cocaine    Social History   Socioeconomic History  . Marital status: Married    Spouse name: None  . Number of children: None  . Years of education: None  . Highest education level: None  Social Needs  . Financial resource strain: None  . Food insecurity - worry: None  . Food insecurity - inability: None  . Transportation needs - medical: None  . Transportation needs - non-medical: None  Occupational History  . None  Tobacco Use  . Smoking status: Current Some Day Smoker    Packs/day: 2.00    Types: Cigarettes  . Smokeless tobacco: Never Used  Substance and Sexual Activity  . Alcohol use: No  . Drug use: Yes    Types: Marijuana, Cocaine  . Sexual activity: Not Currently  Other Topics Concern  . None  Social History Narrative  .  None   Additional Social History:    Allergies:  No Known Allergies  Labs:  Results for orders placed or performed during the hospital encounter of 01/12/18 (from the past 48 hour(s))  Comprehensive metabolic panel     Status: Abnormal   Collection Time: 01/12/18  6:36 PM  Result Value Ref Range   Sodium 134 (L) 135 - 145 mmol/L   Potassium 3.6 3.5 - 5.1 mmol/L   Chloride 106 101 - 111 mmol/L   CO2 21 (L) 22 - 32 mmol/L   Glucose, Bld 96 65 - 99 mg/dL   BUN 6 6 - 20 mg/dL   Creatinine, Ser 0.75 0.44 - 1.00 mg/dL   Calcium 8.8 (L) 8.9 - 10.3 mg/dL   Total Protein 8.4 (H) 6.5 - 8.1 g/dL   Albumin 4.2 3.5 - 5.0 g/dL   AST 26 15 - 41 U/L   ALT 15 14 - 54 U/L   Alkaline Phosphatase 72 38 - 126 U/L   Total Bilirubin 0.5 0.3 - 1.2 mg/dL   GFR calc non Af Amer >60 >60 mL/min   GFR calc Af Amer >60 >60 mL/min    Comment: (NOTE) The eGFR has been calculated using the CKD EPI equation. This calculation has not been validated in all clinical situations. eGFR's persistently <60 mL/min signify possible Chronic Kidney Disease.    Anion gap 7 5 - 15  Comment: Performed at [facility], [address] [place], [place] [address]  Ethanol     Status: None   Collection Time: 01/12/18  6:36 PM  Result Value Ref Range   Alcohol, Ethyl (B) <10 <10 mg/dL    Comment:        LOWEST DETECTABLE LIMIT FOR SERUM ALCOHOL IS 10 mg/dL FOR MEDICAL PURPOSES ONLY Performed at [facility], [address] [place], [place] [address]   cbc     Status: Abnormal   Collection Time: 01/12/18  6:36 PM  Result Value Ref Range   WBC 8.2 3.6 - 11.0 K/uL   RBC 4.63 3.80 - 5.20 MIL/uL   Hemoglobin 9.1 (L) 12.0 - 16.0 g/dL   HCT 29.9 (L) 35.0 - 47.0 %   MCV 64.5 (L) 80.0 - 100.0 fL   MCH 19.7 (L) 26.0 - 34.0 pg   MCHC 30.6 (L) 32.0 - 36.0 g/dL   RDW 19.7 (H) 11.5 - 14.5 %   Platelets 323 150 - 440 K/uL    Comment: Performed at [facility], [address], [place],  [place] [address]  Urine Drug Screen, Qualitative     Status: Abnormal   Collection Time: 01/12/18  9:18 PM  Result Value Ref Range   Tricyclic, Ur Screen NONE DETECTED NONE DETECTED   Amphetamines, Ur Screen NONE DETECTED NONE DETECTED   MDMA (Ecstasy)Ur Screen NONE DETECTED NONE DETECTED   Cocaine Metabolite,Ur [place] POSITIVE (A) NONE DETECTED   Opiate, Ur Screen NONE DETECTED NONE DETECTED   Phencyclidine (PCP) Ur S NONE DETECTED NONE DETECTED   Cannabinoid 50 Ng, Ur [place] POSITIVE (A) NONE DETECTED   Barbiturates, Ur Screen NONE DETECTED NONE DETECTED   Benzodiazepine, Ur Scrn NONE DETECTED NONE DETECTED   Methadone Scn, Ur NONE DETECTED NONE DETECTED    Comment: (NOTE) Tricyclics + metabolites, urine    Cutoff 1000 ng/mL Amphetamines + metabolites, urine  Cutoff 1000 ng/mL MDMA (Ecstasy), urine              Cutoff 500 ng/mL Cocaine Metabolite, urine          Cutoff 300 ng/mL Opiate + metabolites, urine        Cutoff 300 ng/mL Phencyclidine (PCP), urine         Cutoff 25 ng/mL Cannabinoid, urine                 Cutoff 50 ng/mL Barbiturates + metabolites, urine  Cutoff 200 ng/mL Benzodiazepine, urine              Cutoff 200 ng/mL Methadone, urine                   Cutoff 300 ng/mL The urine drug screen provides only a preliminary, unconfirmed analytical test result and should not be used for non-medical purposes. Clinical consideration and professional judgment should be applied to any positive drug screen result due to possible interfering substances. A more specific alternate chemical method must be used in order to obtain a confirmed analytical result. Gas chromatography / mass spectrometry (GC/MS) is the preferred confirmat ory method. Performed at [facility], [address] [place], [place] [address]   Pregnancy, urine POC     Status: None   Collection Time: 01/12/18 10:52 PM  Result Value Ref Range   Preg Test, Ur NEGATIVE NEGATIVE    Comment:        THE SENSITIVITY  OF THIS METHODOLOGY IS >24 mIU/mL  Current Facility-Administered Medications  Medication Dose Route Frequency Provider Last Rate Last Dose  . haloperidol lactate (HALDOL) injection 5 mg  5 mg Intramuscular Q6H PRN Paulette Blanch, MD      . ibuprofen (ADVIL,MOTRIN) tablet 400 mg  400 mg Oral Q6H PRN Harvest Dark, MD   400 mg at 01/13/18 1059  . LORazepam (ATIVAN) injection 2 mg  2 mg Intravenous Q4H PRN Paulette Blanch, MD   2 mg at 01/13/18 0151   Current Outpatient Medications  Medication Sig Dispense Refill  . metroNIDAZOLE (FLAGYL) 500 MG tablet Take 1 tablet (500 mg total) by mouth 2 (two) times daily. (Patient not taking: Reported on 01/12/2018) 14 tablet 0  . QUEtiapine (SEROQUEL) 100 MG tablet Take 1 tablet (100 mg total) by mouth at bedtime. (Patient not taking: Reported on 11/17/2016) 30 tablet 0  . vitamin B-12 1000 MCG tablet Take 1 tablet (1,000 mcg total) by mouth daily. (Patient not taking: Reported on 11/17/2016) 30 tablet 3    Musculoskeletal: Strength & Muscle Tone: within normal limits Gait & Station: normal Patient leans: N/A  Psychiatric Specialty Exam: Physical Exam  Nursing note and vitals reviewed. Constitutional: She appears well-developed and well-nourished.  HENT:  Head: Normocephalic and atraumatic.  Eyes: Conjunctivae are normal. Pupils are equal, round, and reactive to light.  Neck: Normal range of motion.  Cardiovascular: Regular rhythm and normal heart sounds.  Respiratory: Effort normal. No respiratory distress.  GI: Soft.  Musculoskeletal: Normal range of motion.  Neurological: She is alert.  Skin: Skin is warm and dry.  Psychiatric: Her affect is blunt. Her speech is delayed. She is slowed and withdrawn. Thought content is paranoid and delusional. Cognition and memory are impaired. She expresses impulsivity. She expresses no homicidal and no suicidal ideation.    Review of Systems  Constitutional: Negative.   HENT: Negative.   Eyes: Negative.    Respiratory: Negative.   Cardiovascular: Negative.   Gastrointestinal: Negative.   Musculoskeletal: Negative.   Skin: Negative.   Neurological: Negative.   Psychiatric/Behavioral: Positive for depression, hallucinations, memory loss and substance abuse. Negative for suicidal ideas. The patient is nervous/anxious and has insomnia.     Blood pressure (!) 121/93, pulse (!) 106, temperature 98.8 F (37.1 C), temperature source Oral, resp. rate 16, height _0  (1.6 m), weight 59 kg (130 lb), last menstrual period 11/30/2017, SpO2 100 %.Body mass index is 23.03 kg/m.  General Appearance: Disheveled  Eye Contact:  Minimal  Speech:  Slow and Slurred  Volume:  Decreased  Mood:  Dysphoric  Affect:  Constricted  Thought Process:  Disorganized  Orientation:  Negative  Thought Content:  Illogical, Hallucinations: Auditory and Paranoid Ideation  Suicidal Thoughts:  No  Homicidal Thoughts:  No  Memory:  Immediate;   Fair Recent;   Poor Remote;   Fair  Judgement:  Impaired  Insight:  Shallow  Psychomotor Activity:  Decreased  Concentration:  Concentration: Fair  Recall:  [employer] of Knowledge:  Fair  Language:  Fair  Akathisia:  No  Handed:  Right  AIMS (if indicated):     Assets:  Desire for Improvement Housing Physical Health Resilience Social Support  ADL's:  Impaired  Cognition:  Impaired,  Mild  Sleep:        Treatment Plan Summary: Daily contact with patient to assess and evaluate symptoms and progress in treatment, Medication management and Plan 42 year old woman who presented with psychotic symptoms also substance abuse.  Still reporting hallucinations.  I  agree that inpatient hospitalization is most appropriate.  Patient informed of the plan and she agrees as well.  Reviewed with emergency room physician.  Continue IV C.  Orders will be placed to start Seroquel make sure full labs are done.  15-minute checks on admission.  Disposition: Recommend psychiatric Inpatient  admission when medically cleared. Supportive therapy provided about ongoing stressors.  Alethia Berthold, MD 01/13/2018 1:17 PM

## 2018-01-14 ENCOUNTER — Inpatient Hospital Stay
Admission: AD | Admit: 2018-01-14 | Discharge: 2018-01-23 | DRG: 885 | Disposition: A | Discharge status: Home / Self Care | Payer: No Typology Code available for payment source | Attending: Psychiatry | Admitting: Psychiatry

## 2018-01-14 ENCOUNTER — Other Ambulatory Visit: Payer: Self-pay

## 2018-01-14 ENCOUNTER — Encounter: Payer: Self-pay | Admitting: Emergency Medicine

## 2018-01-14 DIAGNOSIS — F419 Anxiety disorder, unspecified: Secondary | ICD-10-CM | POA: Diagnosis present

## 2018-01-14 DIAGNOSIS — F17210 Nicotine dependence, cigarettes, uncomplicated: Secondary | ICD-10-CM | POA: Diagnosis present

## 2018-01-14 DIAGNOSIS — D649 Anemia, unspecified: Secondary | ICD-10-CM | POA: Diagnosis present

## 2018-01-14 DIAGNOSIS — F302 Manic episode, severe with psychotic symptoms: Secondary | ICD-10-CM | POA: Diagnosis not present

## 2018-01-14 DIAGNOSIS — Z560 Unemployment, unspecified: Secondary | ICD-10-CM

## 2018-01-14 DIAGNOSIS — Z79899 Other long term (current) drug therapy: Secondary | ICD-10-CM | POA: Diagnosis not present

## 2018-01-14 DIAGNOSIS — F1210 Cannabis abuse, uncomplicated: Secondary | ICD-10-CM | POA: Diagnosis present

## 2018-01-14 DIAGNOSIS — I10 Essential (primary) hypertension: Secondary | ICD-10-CM | POA: Diagnosis present

## 2018-01-14 DIAGNOSIS — Z9114 Patient's other noncompliance with medication regimen: Secondary | ICD-10-CM

## 2018-01-14 DIAGNOSIS — Z91410 Personal history of adult physical and sexual abuse: Secondary | ICD-10-CM | POA: Diagnosis not present

## 2018-01-14 DIAGNOSIS — F1410 Cocaine abuse, uncomplicated: Secondary | ICD-10-CM

## 2018-01-14 DIAGNOSIS — Z590 Homelessness: Secondary | ICD-10-CM | POA: Diagnosis not present

## 2018-01-14 DIAGNOSIS — F312 Bipolar disorder, current episode manic severe with psychotic features: Principal | ICD-10-CM | POA: Diagnosis present

## 2018-01-14 MED ORDER — TRAZODONE HCL 100 MG PO TABS
100.0000 mg | ORAL_TABLET | Freq: Every evening | ORAL | Status: DC | PRN
  Administered 2018-01-14 – 2018-01-22 (×4): 100 mg via ORAL
  Filled 2018-01-14 (×4): qty 1

## 2018-01-14 MED ORDER — ALUM & MAG HYDROXIDE-SIMETH 200-200-20 MG/5ML PO SUSP
30.0000 mL | ORAL | Status: DC | PRN
  Administered 2018-01-15: 30 mL via ORAL
  Filled 2018-01-14: qty 30

## 2018-01-14 MED ORDER — QUETIAPINE FUMARATE 100 MG PO TABS: 100.0000 mg | ORAL_TABLET | Freq: Every day | ORAL | Status: DC

## 2018-01-14 MED ORDER — HYDROXYZINE HCL 25 MG PO TABS
25.0000 mg | ORAL_TABLET | Freq: Three times a day (TID) | ORAL | Status: DC | PRN
  Administered 2018-01-19: 25 mg via ORAL
  Filled 2018-01-14: qty 1

## 2018-01-14 MED ORDER — QUETIAPINE FUMARATE 25 MG PO TABS
150.0000 mg | ORAL_TABLET | Freq: Every day | ORAL | Status: DC
  Administered 2018-01-14 – 2018-01-17 (×4): 150 mg via ORAL
  Filled 2018-01-14 (×4): qty 1

## 2018-01-14 MED ORDER — ACETAMINOPHEN 325 MG PO TABS
650.0000 mg | ORAL_TABLET | Freq: Four times a day (QID) | ORAL | Status: DC | PRN
  Administered 2018-01-14 – 2018-01-22 (×12): 650 mg via ORAL
  Filled 2018-01-14 (×12): qty 2

## 2018-01-14 MED ORDER — LISINOPRIL 10 MG PO TABS
5.0000 mg | ORAL_TABLET | Freq: Every day | ORAL | Status: DC
  Filled 2018-01-14: qty 0.5

## 2018-01-14 MED ORDER — LORAZEPAM 2 MG/ML IJ SOLN: 2.0000 mg | INTRAMUSCULAR | Status: DC | PRN

## 2018-01-14 MED ORDER — MAGNESIUM HYDROXIDE 400 MG/5ML PO SUSP: 30.0000 mL | Freq: Every day | ORAL | Status: DC | PRN

## 2018-01-14 NOTE — Plan of Care (Addendum)
Patient found in day room upon my arrival. Patient is visible but not social throughout the evening. Complains of jaw pain 7/10, given Tylenol with positive results. Denies SI, HI, AVH. Patient is calm and cooperative with assessment. ADLs are poor. Reports eating and voiding adequately. Given Trazodone for sleep, will monitor for efficacy. Patient behavior is not bizarre, not observed responding, and denies hallucinations. Compliant with HS medications and staff direction. Q 15 minute checks maintained. Will continue to monitor throughout the shift.  Patient slept 7.75 hours. No apparent distress. Will endorse care to oncoming shift.   Progressing Education: Knowledge of [facility] Education information/materials will improve 01/14/2018 2149 - Progressing by Galen ManilaVigil, Tian Davison E, RN 01/14/2018 2144 - Progressing by Galen ManilaVigil, Suri Tafolla E, RN Coping: Ability to verbalize frustrations and anger appropriately will improve 01/14/2018 2144 - Progressing by Galen ManilaVigil, Matayah Reyburn E, RN Ability to demonstrate self-control will improve 01/14/2018 2149 - Progressing by Galen ManilaVigil, Nigil Braman E, RN 01/14/2018 2144 - Progressing by Galen ManilaVigil, Faelynn Wynder E, RN Pain Managment: General experience of comfort will improve 01/14/2018 2149 - Progressing by Galen ManilaVigil, Milano Rosevear E, RN 01/14/2018 2144 - Progressing by Galen ManilaVigil, Jazzlin Clements E, RN
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
## 2018-01-14 NOTE — Tx Team (Signed)
Initial Treatment Plan 01/14/2018 1:26 PM Annette Garrison [id number]    PATIENT STRESSORS: Financial difficulties Substance abuse   PATIENT STRENGTHS: Ability for insight Motivation for treatment/growth Supportive family/friends   PATIENT IDENTIFIED PROBLEMS: "Anxiety"  "Depression"  "Substance Abuse'                 DISCHARGE CRITERIA:  Improved stabilization in mood, thinking, and/or behavior Need for constant or close observation no longer present Verbal commitment to aftercare and medication compliance  PRELIMINARY DISCHARGE PLAN: Return to previous living arrangement  PATIENT/FAMILY INVOLVEMENT: This treatment plan has been presented to and reviewed with the patient, Annette Garrison.  The patient  have been given the opportunity to ask questions and make suggestions.  Curly RimBarbara B Luvia Orzechowski, RN 01/14/2018, 1:26 PM

## 2018-01-14 NOTE — [facility] Group Notes (Signed)
LCSW Group Therapy Note   01/14/2018 1:15pm   Type of Therapy and Topic:  Group Therapy:  Trust and Honesty  Participation Level:  None  Description of Group:    In this group patients will be asked to explore the value of being honest.  Patients will be guided to discuss their thoughts, feelings, and behaviors related to honesty and trusting in others. Patients will process together how trust and honesty relate to forming relationships with peers, family members, and self. Each patient will be challenged to identify and express feelings of being vulnerable. Patients will discuss reasons why people are dishonest and identify alternative outcomes if one was truthful (to self or others). This group will be process-oriented, with patients participating in exploration of their own experiences, giving and receiving support, and processing challenge from other group members.   Therapeutic Goals: 1. Patient will identify why honesty is important to relationships and how honesty overall affects relationships.  2. Patient will identify a situation where they lied or were lied too and the  feelings, thought process, and behaviors surrounding the situation 3. Patient will identify the meaning of being vulnerable, how that feels, and how that correlates to being honest with self and others. 4. Patient will identify situations where they could have told the truth, but instead lied and explain reasons of dishonesty.   Summary of Patient Progress: Pt attended group and did not participate.     Therapeutic Modalities:   Cognitive Behavioral Therapy Solution Focused Therapy Motivational Interviewing Brief Therapy  Annette Garrison  CUEBAS-COLON, LCSW 01/14/2018 12:48 PM
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
## 2018-01-14 NOTE — Progress Notes (Signed)
Admission Note:  42 yr old female who presents IVC in no acute distress for the treatment of hallucinations and behavior problems. Patient has a history of substance use. Patient denies SI and HI and A/V hallucinations  on admission to BMU. Patient does appear to be preoccupied at times during admission assessment. Patient is a little guarded during assessment. Patient is pleasant and cooperative. Patient did not engage in conversation with this writer only minimal. Skin assessment and search done with patient and two staff present . No contraband found. Patient skin is clear. POC and unit policies explained and understanding verbalized. Consents obtained. Food and fluids offered, and was accepted. Pt had no additional questions or concerns.

## 2018-01-14 NOTE — BHH Suicide Risk Assessment (Signed)
BHH INPATIENT:  Family/Significant Other Suicide Prevention Education  Suicide Prevention Education:  Patient Refusal for Family/Significant Other Suicide Prevention Education: The patient Dickie Laoby Lestine BoxFranshasta Watling has refused to provide written consent for family/significant other to be provided Family/Significant Other Suicide Prevention Education during admission and/or prior to discharge.  Physician notified.  Zoila Ditullio  CUEBAS-COLON 01/14/2018, 3:56 PM

## 2018-01-14 NOTE — ED Provider Notes (Signed)
-----------------------------------------   6:52 AM on 01/14/2018 -----------------------------------------   Blood pressure 122/79, pulse 91, temperature 98.3 F (36.8 C), temperature source Oral, resp. rate 16, height 5\' 3"  (1.6 m), weight 59 kg (130 lb), last menstrual period 11/30/2017, SpO2 98 %.  The patient had no acute events since last update.  Calm and cooperative at this time. Awaiting BMU bed.    Irean HongSung, Jade J, MD 01/14/18 [phone]

## 2018-01-14 NOTE — ED Notes (Signed)
Pt discharged to BMU under IVC. Pt accepting. All belongings sent with patient. [facility] [place].
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
## 2018-01-14 NOTE — BH Assessment (Signed)
Patient is to be admitted to [facility] BMU by Dr.Clapacs.  Attending Physician will be Dr.McNew.  Patient has been assigned to room 307, [facility] Nurse Phyllis Intake Paper Work has been signed and placed on patient chart.  ER staff is aware of the admission:  [facility]   Dr. Pershing ProudSchaevitz, ER MD   Amy Patient's Nurse

## 2018-01-15 LAB — IRON AND TIBC
IRON: 11 ug/dL — AB (ref 28–170)
Saturation Ratios: 3 % — ABNORMAL LOW (ref 10.4–31.8)
TIBC: 392 ug/dL (ref 250–450)
UIBC: 381 ug/dL

## 2018-01-15 LAB — LIPID PANEL
CHOL/HDL RATIO: 4.2 ratio
Cholesterol: 163 mg/dL (ref 0–200)
HDL: 39 mg/dL — AB (ref >40)
LDL CALC: 108 mg/dL — AB (ref 0–99)
TRIGLYCERIDES: 82 mg/dL (ref <150)
VLDL: 16 mg/dL (ref 0–40)

## 2018-01-15 LAB — VITAMIN B12: Vitamin B-12: 140 pg/mL — ABNORMAL LOW (ref 180–914)

## 2018-01-15 LAB — FOLATE: Folate: 7.8 ng/mL (ref >5.9)

## 2018-01-15 LAB — TSH: TSH: 1.001 u[IU]/mL (ref 0.350–4.500)

## 2018-01-15 LAB — HEMOGLOBIN A1C
HEMOGLOBIN A1C: 5.4 % (ref 4.8–5.6)
MEAN PLASMA GLUCOSE: 108.28 mg/dL

## 2018-01-15 LAB — HEMOGLOBIN: HEMOGLOBIN: 8.9 g/dL — AB (ref 12.0–16.0)

## 2018-01-15 LAB — FERRITIN: FERRITIN: 3 ng/mL — AB (ref 11–307)

## 2018-01-15 MED ORDER — FERROUS SULFATE 325 (65 FE) MG PO TABS
325.0000 mg | ORAL_TABLET | Freq: Two times a day (BID) | ORAL | Status: DC
  Administered 2018-01-15 – 2018-01-23 (×16): 325 mg via ORAL
  Filled 2018-01-15 (×16): qty 1

## 2018-01-15 MED ORDER — CYANOCOBALAMIN 1000 MCG/ML IJ SOLN: 1000.0000 ug | Freq: Every morning | INTRAMUSCULAR | Status: DC

## 2018-01-15 MED ORDER — VITAMIN B-12 1000 MCG PO TABS
1000.0000 ug | ORAL_TABLET | Freq: Every day | ORAL | Status: DC
  Administered 2018-01-15 – 2018-01-16 (×2): 1000 ug via ORAL
  Filled 2018-01-15 (×2): qty 1

## 2018-01-15 MED ORDER — LOPERAMIDE HCL 2 MG PO CAPS: 2.0000 mg | ORAL_CAPSULE | ORAL | Status: DC | PRN

## 2018-01-15 MED ORDER — VITAMIN B-12 1000 MCG PO TABS: 1000.0000 ug | ORAL_TABLET | Freq: Every day | ORAL | Status: DC

## 2018-01-15 MED ORDER — ONDANSETRON 4 MG PO TBDP: 4.0000 mg | ORAL_TABLET | Freq: Three times a day (TID) | ORAL | Status: DC | PRN

## 2018-01-15 NOTE — Progress Notes (Signed)
Patient has been in the milieu, mostly in the dayroom with peers. Alert and oriented and denying thoughts of self harm. Thought process organized. Was observed playing cards with peers. Pleasant and cooperative. Attended group and ate a snack. Patient presented to the medication room. Received her bed time medications and requested Tylenol for jaw pain rated at 7/10. Patient spent some time talking to this Clinical research associatewriter, discussing about her employment history. 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 Patient reported that her depression is decreasing and thought process organized. No additional concerns. Patient was encouraged to talk to staff as needed. Emotional support provided and staff continue to monitor.

## 2018-01-15 NOTE — Plan of Care (Signed)
Up in the milieu, participated in activities. Able to report feelings and needs, taking medications
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
## 2018-01-15 NOTE — Progress Notes (Signed)
D- Patient alert and oriented. Patient presents in a pleasant mood on assessment with complaints of a headache, rating her pain level a "7/10", requesting pain medication from this Clinical research associatewriter. Patient also complains of nausea and also requested medication from this writer to help alleviate symptoms. Patient rates her depression/anxiety level a "1/10" stating that it's because "of this headache". Patient denies SI, HI, AVH, at this time. Patient states that her goal for today is to "try to feel better".   A- Scheduled medications administered to patient, per MD orders. Support and encouragement provided.  Routine safety checks conducted every 15 minutes.  Patient informed to notify staff with problems or concerns.  R- No adverse drug reactions noted. Patient contracts for safety at this time. Patient compliant with medications and treatment plan. Patient receptive, calm, and cooperative. Patient interacts well with others on the unit.  Patient remains safe at this time.

## 2018-01-15 NOTE — [facility] Group Notes (Signed)
LCSW Group Therapy Note 01/15/2018 1:15pm  Type of Therapy and Topic: Group Therapy: Feelings Around Returning Home & Establishing a Supportive Framework and Supporting Oneself When Supports Not Available  Participation Level: Did Not Attend  Description of Group:  Patients first processed thoughts and feelings about upcoming discharge. These included fears of upcoming changes, lack of change, new living environments, judgements and expectations from others and overall stigma of mental health issues. The group then discussed the definition of a supportive framework, what that looks and feels like, and how do to discern it from an unhealthy non-supportive network. The group identified different types of supports as well as what to do when your family/friends are less than helpful or unavailable  Therapeutic Goals  1. Patient will identify one healthy supportive network that they can use at discharge. 2. Patient will identify one factor of a supportive framework and how to tell it from an unhealthy network. 3. Patient able to identify one coping skill to use when they do not have positive supports from others. 4. Patient will demonstrate ability to communicate their needs through discussion and/or role plays.  Summary of Patient Progress:    Therapeutic Modalities Cognitive Behavioral Therapy Motivational Interviewing   Elijah Michaelis  CUEBAS-COLON, LCSW 01/15/2018 10:30 AM

## 2018-01-16 MED ORDER — VITAMIN B-12 1000 MCG PO TABS
1000.0000 ug | ORAL_TABLET | Freq: Every day | ORAL | Status: DC
  Administered 2018-01-20 – 2018-01-23 (×4): 1000 ug via ORAL
  Filled 2018-01-16 (×4): qty 1

## 2018-01-16 MED ORDER — CYANOCOBALAMIN 1000 MCG/ML IJ SOLN
1000.0000 ug | Freq: Every day | INTRAMUSCULAR | Status: AC
  Administered 2018-01-17 – 2018-01-19 (×3): 1000 ug via INTRAMUSCULAR
  Filled 2018-01-16 (×3): qty 1

## 2018-01-16 NOTE — Progress Notes (Signed)
[facility] MD Progress Note  01/16/2018 3:39 PM Annette Garrison  MRN:  [id number] Subjective:  History was reviewed with patient. She states that she was doing okay until recently. She moved in with her aunt recently and "we got into it." She states that she heard a rumor "from the spirits" that her aunt killed her father. She states, "My aunt got all upset and thought I was talking crazy." She states that she has history of hearing voices. She has been using cocaine recently but minimizes this. She states, "I heard voices even when not using." She states that she used to take Seroquel and was really helpful but stopped because "I wasn't hearing voices at that time." She is now back on Seroquel and feels it is helpful for sleep. She stats that she does not have anywhere to stay now because her aunt "is an evil spirit." She reports voices are improving. Denies SI or thoughts of self harm.  Principal Problem: <principal problem not specified> Diagnosis:   Patient Active Problem List   Diagnosis Date Noted  . Bipolar I disorder, single manic episode, severe, with psychosis (HCC) [F30.2] 01/14/2018  . Cannabis abuse [F12.10]   . Vitamin B12 deficiency [E53.8] 06/08/2016  . Cocaine use disorder, moderate, dependence (HCC) [F14.20] 06/07/2016  . Tobacco use disorder [F17.200] 06/07/2016  . Anemia [D64.9] 06/07/2016  . Bipolar I disorder, current or most recent episode manic, with psychotic features (HCC) [F31.2] 06/06/2016  . Cocaine abuse (HCC) [F14.10] 08/04/2015  . Dysmenorrhea [N94.6] 08/04/2015  . Hypertension [I10] 08/04/2015   Total Time spent with patient: 20 minutes  Past Psychiatric History: See H&P  Past Medical History:  Past Medical History:  Diagnosis Date  . Anemia   . Bipolar disorder (HCC)   . Hypertension     Past Surgical History:  Procedure Laterality Date  . CESAREAN SECTION     Family History: History reviewed. No pertinent family history. Family Psychiatric  History:  See H&P Social History:  Social History   Substance and Sexual Activity  Alcohol Use No     Social History   Substance and Sexual Activity  Drug Use Yes  . Types: Marijuana, Cocaine    Social History   Socioeconomic History  . Marital status: Married    Spouse name: None  . Number of children: None  . Years of education: None  . Highest education level: None  Social Needs  . Financial resource strain: None  . Food insecurity - worry: None  . Food insecurity - inability: None  . Transportation needs - medical: None  . Transportation needs - non-medical: None  Occupational History  . None  Tobacco Use  . Smoking status: Current Some Day Smoker    Packs/day: 2.00    Types: Cigarettes  . Smokeless tobacco: Never Used  Substance and Sexual Activity  . Alcohol use: No  . Drug use: Yes    Types: Marijuana, Cocaine  . Sexual activity: Not Currently  Other Topics Concern  . None  Social History Narrative  . None   Additional Social History:                         Sleep: Good  Appetite:  Good  Current Medications: Current Facility-Administered Medications  Medication Dose Route Frequency Provider Last Rate Last Dose  . acetaminophen (TYLENOL) tablet 650 mg  650 mg Oral Q6H PRN Clapacs, Jackquline Denmark, MD   650 mg at 01/15/18 2152  .  alum & mag hydroxide-simeth (MAALOX/MYLANTA) 200-200-20 MG/5ML suspension 30 mL  30 mL Oral Q4H PRN Clapacs, John T, MD   30 mL at 01/15/18 1323  . cyanocobalamin ((VITAMIN B-12)) injection 1,000 mcg  1,000 mcg Intramuscular q morning - 10a Darliss Ridgel, MD      . ferrous sulfate tablet 325 mg  325 mg Oral BID WC Darliss Ridgel, MD   325 mg at 01/16/18 1610  . hydrOXYzine (ATARAX/VISTARIL) tablet 25 mg  25 mg Oral TID PRN Clapacs, Jackquline Denmark, MD      . loperamide (IMODIUM) capsule 2 mg  2 mg Oral PRN Darliss Ridgel, MD      . magnesium hydroxide (MILK OF MAGNESIA) suspension 30 mL  30 mL Oral Daily PRN Clapacs, John T, MD      .  ondansetron (ZOFRAN-ODT) disintegrating tablet 4 mg  4 mg Oral Q8H PRN Darliss Ridgel, MD      . QUEtiapine (SEROQUEL) tablet 150 mg  150 mg Oral QHS Darliss Ridgel, MD   150 mg at 01/15/18 2152  . traZODone (DESYREL) tablet 100 mg  100 mg Oral QHS PRN Clapacs, Jackquline Denmark, MD   100 mg at 01/14/18 2051  . vitamin B-12 (CYANOCOBALAMIN) tablet 1,000 mcg  1,000 mcg Oral Daily Darliss Ridgel, MD   1,000 mcg at 01/16/18 9604    Lab Results:  Results for orders placed or performed during the hospital encounter of 01/14/18 (from the past 48 hour(s))  Hemoglobin     Status: Abnormal   Collection Time: 01/15/18  6:53 AM  Result Value Ref Range   Hemoglobin 8.9 (L) 12.0 - 16.0 g/dL    Comment: Performed at [facility], [address] [place], [place] [address]  Ferritin     Status: Abnormal   Collection Time: 01/15/18  6:53 AM  Result Value Ref Range   Ferritin 3 (L) 11 - 307 ng/mL    Comment: Performed at [facility], [address] [place], [place] [address]  Iron and TIBC     Status: Abnormal   Collection Time: 01/15/18  6:53 AM  Result Value Ref Range   Iron 11 (L) 28 - 170 ug/dL   TIBC 782 956 - 213 ug/dL   Saturation Ratios 3 (L) 10.4 - 31.8 %   UIBC 381 ug/dL    Comment: Performed at [facility], [address] [place], [place] [address]  Vitamin B12     Status: Abnormal   Collection Time: 01/15/18  6:53 AM  Result Value Ref Range   Vitamin B-12 140 (L) 180 - 914 pg/mL    Comment: (NOTE) This assay is not validated for testing neonatal or myeloproliferative syndrome specimens for Vitamin B12 levels. Performed at [facility], [address], [place], [place] [address]   Folate     Status: None   Collection Time: 01/15/18  6:53 AM  Result Value Ref Range   Folate 7.8 >5.9 ng/mL    Comment: Performed at [facility], [address] [place], [place] [address]  Hemoglobin A1c     Status: None   Collection Time: 01/15/18  6:53 AM   Result Value Ref Range   Hgb A1c MFr Bld 5.4 4.8 - 5.6 %    Comment: (NOTE) Pre diabetes:          5.7%-6.4% Diabetes:              >6.4% Glycemic control for   <  7.0% adults with diabetes    Mean Plasma Glucose 108.28 mg/dL    Comment: Performed at [facility], [address] [place], [place] [address]  Lipid panel     Status: Abnormal   Collection Time: 01/15/18  6:53 AM  Result Value Ref Range   Cholesterol 163 0 - 200 mg/dL   Triglycerides 82 <604 mg/dL   HDL 39 (L) >54 mg/dL   Total CHOL/HDL Ratio 4.2 RATIO   VLDL 16 0 - 40 mg/dL   LDL Cholesterol 098 (H) 0 - 99 mg/dL    Comment:        Total Cholesterol/HDL:CHD Risk Coronary Heart Disease Risk Table                     Men   Women  1/2 Average Risk   3.4   3.3  Average Risk       5.0   4.4  2 X Average Risk   9.6   7.1  3 X Average Risk  23.4   11.0        Use the calculated Patient Ratio above and the CHD Risk Table to determine the patient's CHD Risk.        ATP III CLASSIFICATION (LDL):  <100     mg/dL   Optimal  119-147  mg/dL   Near or Above                    Optimal  130-159  mg/dL   Borderline  829-562  mg/dL   High  >130     mg/dL   Very High Performed at [facility], [address] [place], [place] [address]   TSH     Status: None   Collection Time: 01/15/18  6:53 AM  Result Value Ref Range   TSH 1.001 0.350 - 4.500 uIU/mL    Comment: Performed by a 3rd Generation assay with a functional sensitivity of <=0.01 uIU/mL. Performed at [facility], [address] [place], [place] [address]     Blood Alcohol level:  Lab Results  Component Value Date   [facility] <10 01/12/2018   ETH <5 06/05/2016    Metabolic Disorder Labs: Lab Results  Component Value Date   HGBA1C 5.4 01/15/2018   MPG 108.28 01/15/2018   Lab Results  Component Value Date   PROLACTIN 24.4 (H) 06/08/2016   Lab Results  Component Value Date   CHOL 163 01/15/2018   TRIG 82 01/15/2018   HDL 39 (L)  01/15/2018   CHOLHDL 4.2 01/15/2018   VLDL 16 01/15/2018   LDLCALC 108 (H) 01/15/2018   LDLCALC 93 06/08/2016    Physical Findings: AIMS:  , ,  ,  ,    CIWA:  CIWA-Ar Total: 0 COWS:     Musculoskeletal: Strength & Muscle Tone: within normal limits Gait & Station: normal Patient leans: N/A  Psychiatric Specialty Exam: Physical Exam  Nursing note and vitals reviewed.   Review of Systems  All other systems reviewed and are negative.   Blood pressure 125/75, pulse 83, temperature 98.5 F (36.9 C), temperature source Oral, resp. rate 18, height 5\' 3"  (1.6 m), weight 71.2 kg (157 lb), SpO2 100 %.Body mass index is 27.81 kg/m.  General Appearance: Casual  Eye Contact:  Fair  Speech:  Clear and Coherent  Volume:  Normal  Mood:  Depressed  Affect:  Congruent  Thought Process:  Coherent and Goal Directed, some bizarre statements  at times  Orientation:  Full (Time, Place, and Person)  Thought Content:  Logical  Suicidal Thoughts:  No  Homicidal Thoughts:  No  Memory:  Immediate;   Fair  Judgement:  Impaired  Insight:  Lacking  Psychomotor Activity:  Normal  Concentration:  Concentration: Poor  Recall:  [employer]Fair  Fund of Knowledge:  Fair  Language:  Fair  Akathisia:  No      Assets:  Communication Skills Resilience  ADL's:  Intact  Cognition:  WNL  Sleep:  Number of Hours: 6.45     Treatment Plan Summary: 42 yo female admitted due to delusions and paranoia in the setting of medication noncompliance and cocaine use. Pt has been calm on the unit. She still has some delusions about spirits but overall improving and more organized. Stimulant use likely has contributed to psychosis.  She does not want to increase Seroquel at this time.   Plan:   Bipolar disorder -Continue Seroquel 150 mg qhs. She does not want to increase at this time. -EKG normal QTc 437  Cocaine use disorder, cannabis use disorder: The patient was advised to abstain from all illicit drugs as they may  worsen mood symptoms. She will be encouraged and her meaningful recovery program at the time of discharge. At this time, she is opposed to any residential substance abuse treatment.  Hypertension: The patient does have a history of hypertension but currently blood pressure is stable off of lisinopril.Annette Garrison. We'll monitor vital signs.  Low B12 -Pt did not receive IM injections. Will reschedule these for 3 doses and then resume oral on Friday  Dispo -Pt refuses to stay with her aunt and is homeless. She will follow up with [facility]. She will not sign consent to speak with familiy.    Haskell RilingHolly R Chrystina Naff, MD 01/16/2018, 3:39 PM

## 2018-01-16 NOTE — Tx Team (Signed)
Interdisciplinary Treatment and Diagnostic Plan Update  01/16/2018 Time of Session: 11:00am Annette Garrison MRN: [id number]  Principal Diagnosis: <principal problem not specified>  Secondary Diagnoses: Active Problems:   Cocaine abuse (HCC)   Bipolar I disorder, single manic episode, severe, with psychosis (HCC)   Cannabis abuse   Current Medications:  Current Facility-Administered Medications  Medication Dose Route Frequency Provider Last Rate Last Dose  . acetaminophen (TYLENOL) tablet 650 mg  650 mg Oral Q6H PRN Clapacs, Jackquline DenmarkJohn T, MD   650 mg at 01/15/18 2152  . alum & mag hydroxide-simeth (MAALOX/MYLANTA) 200-200-20 MG/5ML suspension 30 mL  30 mL Oral Q4H PRN Clapacs, John T, MD   30 mL at 01/15/18 1323  . [START ON 01/17/2018] cyanocobalamin ((VITAMIN B-12)) injection 1,000 mcg  1,000 mcg Intramuscular Daily McNew, Holly R, MD      . ferrous sulfate tablet 325 mg  325 mg Oral BID WC Darliss RidgelKapur, Aarti K, MD   325 mg at 01/16/18 [id number]  . hydrOXYzine (ATARAX/VISTARIL) tablet 25 mg  25 mg Oral TID PRN Clapacs, Jackquline DenmarkJohn T, MD      . loperamide (IMODIUM) capsule 2 mg  2 mg Oral PRN Darliss RidgelKapur, Aarti K, MD      . magnesium hydroxide (MILK OF MAGNESIA) suspension 30 mL  30 mL Oral Daily PRN Clapacs, John T, MD      . ondansetron (ZOFRAN-ODT) disintegrating tablet 4 mg  4 mg Oral Q8H PRN Darliss RidgelKapur, Aarti K, MD      . QUEtiapine (SEROQUEL) tablet 150 mg  150 mg Oral QHS Darliss RidgelKapur, Aarti K, MD   150 mg at 01/15/18 2152  . traZODone (DESYREL) tablet 100 mg  100 mg Oral QHS PRN Clapacs, Jackquline DenmarkJohn T, MD   100 mg at 01/14/18 2051  . [START ON 01/20/2018] vitamin B-12 (CYANOCOBALAMIN) tablet 1,000 mcg  1,000 mcg Oral Daily McNew, Ileene HutchinsonHolly R, MD       PTA Medications: Medications Prior to Admission  Medication Sig Dispense Refill Last Dose  . metroNIDAZOLE (FLAGYL) 500 MG tablet Take 1 tablet (500 mg total) by mouth 2 (two) times daily. (Patient not taking: Reported on 01/12/2018) 14 tablet 0 Not Taking at Unknown time  .  QUEtiapine (SEROQUEL) 100 MG tablet Take 1 tablet (100 mg total) by mouth at bedtime. (Patient not taking: Reported on 11/17/2016) 30 tablet 0 Not Taking at Unknown time  . vitamin B-12 1000 MCG tablet Take 1 tablet (1,000 mcg total) by mouth daily. (Patient not taking: Reported on 11/17/2016) 30 tablet 3 Not Taking at Unknown time    Patient Stressors: Financial difficulties Substance abuse  Patient Strengths: Ability for insight Motivation for treatment/growth Supportive family/friends  Treatment Modalities: Medication Management, Group therapy, Case management,  1 to 1 session with clinician, Psychoeducation, Recreational therapy.   Physician Treatment Plan for Primary Diagnosis: <principal problem not specified> Long Term Goal(s): Improvement in symptoms so as ready for discharge Improvement in symptoms so as ready for discharge   Short Term Goals: Ability to identify changes in lifestyle to reduce recurrence of condition will improve Ability to verbalize feelings will improve Ability to identify and develop effective coping behaviors will improve Ability to maintain clinical measurements within normal limits will improve Compliance with prescribed medications will improve Ability to identify triggers associated with substance abuse/mental health issues will improve Ability to identify changes in lifestyle to reduce recurrence of condition will improve Ability to verbalize feelings will improve Ability to identify and develop effective coping behaviors will improve Ability to  maintain clinical measurements within normal limits will improve Compliance with prescribed medications will improve Ability to identify triggers associated with substance abuse/mental health issues will improve  Medication Management: Evaluate patient's response, side effects, and tolerance of medication regimen.  Therapeutic Interventions: 1 to 1 sessions, Unit Group sessions and Medication  administration.  Evaluation of Outcomes: Progressing  Physician Treatment Plan for Secondary Diagnosis: Active Problems:   Cocaine abuse (HCC)   Bipolar I disorder, single manic episode, severe, with psychosis (HCC)   Cannabis abuse  Long Term Goal(s): Improvement in symptoms so as ready for discharge Improvement in symptoms so as ready for discharge   Short Term Goals: Ability to identify changes in lifestyle to reduce recurrence of condition will improve Ability to verbalize feelings will improve Ability to identify and develop effective coping behaviors will improve Ability to maintain clinical measurements within normal limits will improve Compliance with prescribed medications will improve Ability to identify triggers associated with substance abuse/mental health issues will improve Ability to identify changes in lifestyle to reduce recurrence of condition will improve Ability to verbalize feelings will improve Ability to identify and develop effective coping behaviors will improve Ability to maintain clinical measurements within normal limits will improve Compliance with prescribed medications will improve Ability to identify triggers associated with substance abuse/mental health issues will improve     Medication Management: Evaluate patient's response, side effects, and tolerance of medication regimen.  Therapeutic Interventions: 1 to 1 sessions, Unit Group sessions and Medication administration.  Evaluation of Outcomes: Progressing   RN Treatment Plan for Primary Diagnosis: <principal problem not specified> Long Term Goal(s): Knowledge of disease and therapeutic regimen to maintain health will improve  Short Term Goals: Ability to participate in decision making will improve, Ability to identify and develop effective coping behaviors will improve and Compliance with prescribed medications will improve  Medication Management: RN will administer medications as ordered by  provider, will assess and evaluate patient's response and provide education to patient for prescribed medication. RN will report any adverse and/or side effects to prescribing provider.  Therapeutic Interventions: 1 on 1 counseling sessions, Psychoeducation, Medication administration, Evaluate responses to treatment, Monitor vital signs and CBGs as ordered, Perform/monitor CIWA, COWS, AIMS and Fall Risk screenings as ordered, Perform wound care treatments as ordered.  Evaluation of Outcomes: Progressing   LCSW Treatment Plan for Primary Diagnosis: <principal problem not specified> Long Term Goal(s): Safe transition to appropriate next level of care at discharge, Engage patient in therapeutic group addressing interpersonal concerns.  Short Term Goals: Engage patient in aftercare planning with referrals and resources, Identify triggers associated with mental health/substance abuse issues and Increase skills for wellness and recovery  Therapeutic Interventions: Assess for all discharge needs, 1 to 1 time with Social worker, Explore available resources and support systems, Assess for adequacy in community support network, Educate family and significant other(s) on suicide prevention, Complete Psychosocial Assessment, Interpersonal group therapy.  Evaluation of Outcomes: Progressing   Progress in Treatment: Attending groups: Yes. Participating in groups: No. Taking medication as prescribed: Yes. Toleration medication: Yes. Family/Significant other contact made: Yes, individual(s) contacted:    Patient understands diagnosis: Yes. Discussing patient identified problems/goals with staff: Yes. Medical problems stabilized or resolved: Yes. Denies suicidal/homicidal ideation: Yes. Issues/concerns per patient self-inventory: No. Other:    New problem(s) identified: No, Describe:     New Short Term/Long Term Goal(s): To go home   Discharge Plan or Barriers:   Reason for Continuation of  Hospitalization: Hallucinations Medication stabilization  Estimated  Length of Stay: 3-5 days  Attendees: Patient:Annette Garrison 01/16/2018 4:05 PM  Physician: Corinna Gab, MD 01/16/2018 4:05 PM  Nursing: Rex Kras, RN 01/16/2018 4:05 PM  RN Care Manager: 01/16/2018 4:05 PM  Social Worker: Jake Shark, LCSW 01/16/2018 4:05 PM  Recreational Therapist:  01/16/2018 4:05 PM  Other:  01/16/2018 4:05 PM  Other:  01/16/2018 4:05 PM  Other: 01/16/2018 4:05 PM    Scribe for Treatment Team: Glennon Mac, LCSW 01/16/2018 4:05 PM

## 2018-01-16 NOTE — [facility] Group Notes (Signed)
LCSW Group Therapy Note   01/16/2018 1:00pm   Type of Therapy and Topic:  Group Therapy:  Overcoming Obstacles   Participation Level:  Minimal   Description of Group:    In this group patients will be encouraged to explore what they see as obstacles to their own wellness and recovery. They will be guided to discuss their thoughts, feelings, and behaviors related to these obstacles. The group will process together ways to cope with barriers, with attention given to specific choices patients can make. Each patient will be challenged to identify changes they are motivated to make in order to overcome their obstacles. This group will be process-oriented, with patients participating in exploration of their own experiences as well as giving and receiving support and challenge from other group members.   Therapeutic Goals: 1. Patient will identify personal and current obstacles as they relate to admission. 2. Patient will identify barriers that currently interfere with their wellness or overcoming obstacles.  3. Patient will identify feelings, thought process and behaviors related to these barriers. 4. Patient will identify two changes they are willing to make to overcome these obstacles:      Summary of Patient Progress Pt shared she was having a okay day but would not participate in rest of group conversation.  She was attentive but at times seemed to be laughing/smiling to herself. Possibly reacting to internal stimuli.       Therapeutic Modalities:   Cognitive Behavioral Therapy Solution Focused Therapy Motivational Interviewing Relapse Prevention Therapy  Glennon MacSara P Shivaun Bilello, LCSW 01/16/2018 4:23 PM

## 2018-01-16 NOTE — Plan of Care (Signed)
Patient contract for safety of self and others and denies any thoughts of suicidal ideation and no signs of AVH noted at this time, patient is cooperating with medication regimen and participating in group activities with peers, patient is encouraged to be more assertive and go to bed early, 15 minute safety rounds in progress, no acute distress noted. Progressing Activity: Sleeping patterns will improve 01/16/2018 2219 - Progressing by Lelan PonsAriwodo, Oluwatobiloba Martin, RN Education: Knowledge of [facility] Education information/materials will improve 01/16/2018 2219 - Progressing by Lelan PonsAriwodo, Dustee Bottenfield, RN Coping: Ability to verbalize frustrations and anger appropriately will improve 01/16/2018 2219 - Progressing by Lelan PonsAriwodo, Daouda Lonzo, RN Ability to demonstrate self-control will improve 01/16/2018 2219 - Progressing by Lelan PonsAriwodo, Jatavia Keltner, RN Education: Ability to state activities that reduce stress will improve 01/16/2018 2219 - Progressing by Lelan PonsAriwodo, Donatello Kleve, RN Self-Concept: Ability to verbalize positive feelings about self will improve 01/16/2018 2219 - Progressing by Lelan PonsAriwodo, Aspynn Clover, RN Pain Managment: General experience of comfort will improve 01/16/2018 2219 - Progressing by Lelan PonsAriwodo, Dajon Lazar, RN

## 2018-01-16 NOTE — Plan of Care (Signed)
Patient progressing in all areas. 

## 2018-01-17 MED ORDER — HALOPERIDOL 5 MG PO TABS
5.0000 mg | ORAL_TABLET | Freq: Every day | ORAL | Status: DC
  Administered 2018-01-17 – 2018-01-19 (×3): 5 mg via ORAL
  Filled 2018-01-17 (×3): qty 1

## 2018-01-17 NOTE — Plan of Care (Signed)
  Progressing Coping: Ability to verbalize frustrations and anger appropriately will improve 01/17/2018 1903 - Progressing by Elige Radonobb, Luisfelipe Engelstad B, RN Ability to demonstrate self-control will improve 01/17/2018 1903 - Progressing by Elige Radonobb, Cleotha Tsang B, RN Education: Ability to state activities that reduce stress will improve 01/17/2018 1903 - Progressing by Elige Radonobb, Evertt Chouinard B, RN Self-Concept: Ability to verbalize positive feelings about self will improve 01/17/2018 1903 - Progressing by Elige Radonobb, Ece Cumberland B, RN Pain Managment: General experience of comfort will improve 01/17/2018 1903 - Progressing by Elige Radonobb, Shaye Lagace B, RN  Patient is pleasant and cooperative.  Denies SI/HI/AVH.    Isolates to room.  No group attendance. Inappropriate laughter noted at times.  Support and encouragement offered.  Safety maintained.

## 2018-01-17 NOTE — Plan of Care (Signed)
Patient is calm and relaxed, stating that her medications is helping her deal with anxiety and she is happy she is getting better so she can go back to work. Patient contract for for safety and denies any thought of SI/H and No signes of AVH noted 15 minute safety rounds in progress. Progressing Activity: Sleeping patterns will improve 01/17/2018 2327 - Progressing by Lelan PonsAriwodo, Johni Narine, RN Education: Knowledge of [place] [facility] Education information/materials will improve 01/17/2018 2327 - Progressing by Lelan PonsAriwodo, Rucha Wissinger, RN Coping: Ability to verbalize frustrations and anger appropriately will improve 01/17/2018 2327 - Progressing by Lelan PonsAriwodo, [place] Sarinana, RN Ability to demonstrate self-control will improve 01/17/2018 2327 - Progressing by Lelan PonsAriwodo, Cordell Guercio, RN Education: Ability to state activities that reduce stress will improve 01/17/2018 2327 - Progressing by Lelan PonsAriwodo, Dayelin Balducci, RN Self-Concept: Ability to verbalize positive feelings about self will improve 01/17/2018 2327 - Progressing by Lelan PonsAriwodo, Arli Bree, RN Pain Managment: General experience of comfort will improve 01/17/2018 2327 - Progressing by Lelan PonsAriwodo, Olanda Boughner, RN

## 2018-01-17 NOTE — [facility] Group Notes (Signed)
  01/17/2018  Time: 0900  Type of Therapy and Topic:  Group Therapy:  Setting Goals Participation Level:  Did Not Attend  Description of Group: In this process group, patients discussed using strengths to work toward goals and address challenges.  Patients identified two positive things about themselves and one goal they were working on.  Patients were given the opportunity to share openly and support each other's plan for self-empowerment.  The group discussed the value of gratitude and were encouraged to have a daily reflection of positive characteristics or circumstances.  Patients were encouraged to identify a plan to utilize their strengths to work on current challenges and goals.  Therapeutic Goals 1. Patient will verbalize personal strengths/positive qualities and relate how these can assist with achieving desired personal goals 2. Patients will verbalize affirmation of peers plans for personal change and goal setting 3. Patients will explore the value of gratitude and positive focus as related to successful achievement of goals 4. Patients will verbalize a plan for regular reinforcement of personal positive qualities and circumstances.  Summary of Patient Progress: Pt was invited to attend group but chose not to attend. CSW will continue to encourage pt to attend group throughout their admission.    Therapeutic Modalities Cognitive Behavioral Therapy Motivational Interviewing  Heidi DachKelsey Alyshia Kernan, MSW, LCSW 01/17/2018 9:34 AM

## 2018-01-17 NOTE — Progress Notes (Signed)
[facility] MD Progress Note  01/17/2018 11:06 AM Annette Garrison  MRN:  [id number] Subjective:  Pt states that she is doing okay today. She states that mood is improving. She continues to endorse AH. She states, "They sound like many people talking." She states, "They are sleeping right now." She states taht occur mostly during the day. She does not want to go up further on Seroquel because it makes her sedated. She wants to continue it for now because it really helps her sleep. She is open to adding another medication to help further with the voices. She denies that the voices tell her to harm herself or others. She states that she plans to call her aunt in the next day or so and wants to go back to stay with her. Less delusional content today. She reports sleeping well. Denies SI or HI. She has been very calm and pleasant on the unit.   Principal Problem: <principal problem not specified> Diagnosis:   Patient Active Problem List   Diagnosis Date Noted  . Bipolar I disorder, single manic episode, severe, with psychosis (HCC) [F30.2] 01/14/2018  . Cannabis abuse [F12.10]   . Vitamin B12 deficiency [E53.8] 06/08/2016  . Cocaine use disorder, moderate, dependence (HCC) [F14.20] 06/07/2016  . Tobacco use disorder [F17.200] 06/07/2016  . Anemia [D64.9] 06/07/2016  . Bipolar I disorder, current or most recent episode manic, with psychotic features (HCC) [F31.2] 06/06/2016  . Cocaine abuse (HCC) [F14.10] 08/04/2015  . Dysmenorrhea [N94.6] 08/04/2015  . Hypertension [I10] 08/04/2015   Total Time spent with patient: 20 minutes  Past Psychiatric History: See H&P  Past Medical History:  Past Medical History:  Diagnosis Date  . Anemia   . Bipolar disorder (HCC)   . Hypertension     Past Surgical History:  Procedure Laterality Date  . CESAREAN SECTION     Family History: History reviewed. No pertinent family history. Family Psychiatric  History: See H&P Social History:  Social History    Substance and Sexual Activity  Alcohol Use No     Social History   Substance and Sexual Activity  Drug Use Yes  . Types: Marijuana, Cocaine    Social History   Socioeconomic History  . Marital status: Married    Spouse name: None  . Number of children: None  . Years of education: None  . Highest education level: None  Social Needs  . Financial resource strain: None  . Food insecurity - worry: None  . Food insecurity - inability: None  . Transportation needs - medical: None  . Transportation needs - non-medical: None  Occupational History  . None  Tobacco Use  . Smoking status: Current Some Day Smoker    Packs/day: 2.00    Types: Cigarettes  . Smokeless tobacco: Never Used  Substance and Sexual Activity  . Alcohol use: No  . Drug use: Yes    Types: Marijuana, Cocaine  . Sexual activity: Not Currently  Other Topics Concern  . None  Social History Narrative  . None   Additional Social History:                         Sleep: Good  Appetite:  Fair  Current Medications: Current Facility-Administered Medications  Medication Dose Route Frequency Provider Last Rate Last Dose  . acetaminophen (TYLENOL) tablet 650 mg  650 mg Oral Q6H PRN Clapacs, Jackquline Denmark, MD   650 mg at 01/15/18 2152  . alum & mag hydroxide-simeth (  MAALOX/MYLANTA) 200-200-20 MG/5ML suspension 30 mL  30 mL Oral Q4H PRN Clapacs, John T, MD   30 mL at 01/15/18 1323  . cyanocobalamin ((VITAMIN B-12)) injection 1,000 mcg  1,000 mcg Intramuscular Daily McNew, Ileene Hutchinson, MD   1,000 mcg at 01/17/18 0935  . ferrous sulfate tablet 325 mg  325 mg Oral BID WC Darliss Ridgel, MD   325 mg at 01/17/18 0935  . haloperidol (HALDOL) tablet 5 mg  5 mg Oral Daily McNew, Ileene Hutchinson, MD      . hydrOXYzine (ATARAX/VISTARIL) tablet 25 mg  25 mg Oral TID PRN Clapacs, Jackquline Denmark, MD      . loperamide (IMODIUM) capsule 2 mg  2 mg Oral PRN Darliss Ridgel, MD      . magnesium hydroxide (MILK OF MAGNESIA) suspension 30 mL  30 mL  Oral Daily PRN Clapacs, John T, MD      . ondansetron (ZOFRAN-ODT) disintegrating tablet 4 mg  4 mg Oral Q8H PRN Darliss Ridgel, MD      . QUEtiapine (SEROQUEL) tablet 150 mg  150 mg Oral QHS Darliss Ridgel, MD   150 mg at 01/16/18 2117  . traZODone (DESYREL) tablet 100 mg  100 mg Oral QHS PRN Clapacs, Jackquline Denmark, MD   100 mg at 01/14/18 2051  . [START ON 01/20/2018] vitamin B-12 (CYANOCOBALAMIN) tablet 1,000 mcg  1,000 mcg Oral Daily McNew, Ileene Hutchinson, MD        Lab Results: No results found for this or any previous visit (from the past 48 hour(s)).  Blood Alcohol level:  Lab Results  Component Value Date   ETH <10 01/12/2018   ETH <5 06/05/2016    Metabolic Disorder Labs: Lab Results  Component Value Date   HGBA1C 5.4 01/15/2018   MPG 108.28 01/15/2018   Lab Results  Component Value Date   PROLACTIN 24.4 (H) 06/08/2016   Lab Results  Component Value Date   CHOL 163 01/15/2018   TRIG 82 01/15/2018   HDL 39 (L) 01/15/2018   CHOLHDL 4.2 01/15/2018   VLDL 16 01/15/2018   LDLCALC 108 (H) 01/15/2018   LDLCALC 93 06/08/2016    Physical Findings: AIMS:  , ,  ,  ,    CIWA:  CIWA-Ar Total: 0 COWS:     Musculoskeletal: Strength & Muscle Tone: within normal limits Gait & Station: normal Patient leans: N/A  Psychiatric Specialty Exam: Physical Exam  Nursing note and vitals reviewed.   Review of Systems  All other systems reviewed and are negative.   Blood pressure 115/75, pulse 100, temperature 98.6 F (37 C), temperature source Oral, resp. rate 18, height 5\' 3"  (1.6 m), weight 71.2 kg (157 lb), SpO2 100 %.Body mass index is 27.81 kg/m.  General Appearance: Casual  Eye Contact:  Fair  Speech:  Clear and Coherent  Volume:  Normal  Mood:  Euthymic  Affect:  Appropriate  Thought Process:  Coherent  Orientation:  Full (Time, Place, and Person)  Thought Content:  Hallucinations: Auditory  Suicidal Thoughts:  No  Homicidal Thoughts:  No  Memory:  Immediate;   Fair   Judgement:  Fair  Insight:  Fair  Psychomotor Activity:  Normal  Concentration:  Concentration: Fair  Recall:  [employer] of Knowledge:  Fair  Language:  Fair  Akathisia:  No      Assets:  Resilience  ADL's:  Intact  Cognition:  WNL  Sleep:  Number of Hours: 6  Treatment Plan Summary: 42 yo female admitted due to paranoia and delusions. She does have history of stimulant abuse but also endorses hallucinations when not using. She does appear to be responding to internal stimuli at times on the unit. She does not want to adjust Seroquel due to oversedation. She is open to a trial of Haldol for AH. She can possibly taper off Seroquel in the future if Haldol effective for her. She does not appear manic.   Bipolar disorder vs. Schizoaffective disorder -Continue Seroquel 150 mg qhs. She does not want to increase or stop at this time. -Start Haldol 5 mg daily for AH. Will discuss tapering off Seroquel if Haldol effective to avoid polypharmacy -EKG normal with QTc at 437  HTN -Bp stable off medications  Low B12 -Pt did not receive IM injections. Will reschedule these for 3 doses and then resume oral on Friday  Dispo -Pt now wants to stay with aunt on discharge. She states that she will contact her soon. Marland Kitchen. She will follow up with [facility]. She has not signed consent for us to talk to her aunt   Haskell RilingHolly R McNew, MD 01/17/2018, 11:06 AM

## 2018-01-17 NOTE — [facility] Group Notes (Signed)
01/17/2018 1PM  Type of Therapy/Topic:  Group Therapy:  Feelings about Diagnosis  Participation Level:  Did Not Attend   Description of Group:   This group will allow patients to explore their thoughts and feelings about diagnoses they have received. Patients will be guided to explore their level of understanding and acceptance of these diagnoses. Facilitator will encourage patients to process their thoughts and feelings about the reactions of others to their diagnosis and will guide patients in identifying ways to discuss their diagnosis with significant others in their lives. This group will be process-oriented, with patients participating in exploration of their own experiences, giving and receiving support, and processing challenge from other group members.   Therapeutic Goals: 1. Patient will demonstrate understanding of diagnosis as evidenced by identifying two or more symptoms of the disorder 2. Patient will be able to express two feelings regarding the diagnosis 3. Patient will demonstrate their ability to communicate their needs through discussion and/or role play  Summary of Patient Progress: Patient was encouraged and invited to attend group. Patient did not attend group. Social worker will continue to encourage group participation in the future.        Therapeutic Modalities:   Cognitive Behavioral Therapy Brief Therapy Feelings Identification    Johny ShearsCassandra  Carlisa Eble, LCSW 01/17/2018 1:48 PM

## 2018-01-18 MED ORDER — QUETIAPINE FUMARATE 100 MG PO TABS
100.0000 mg | ORAL_TABLET | Freq: Every day | ORAL | Status: DC
  Administered 2018-01-18: 100 mg via ORAL
  Filled 2018-01-18: qty 1

## 2018-01-18 MED ORDER — BENZTROPINE MESYLATE 1 MG PO TABS
0.5000 mg | ORAL_TABLET | Freq: Two times a day (BID) | ORAL | Status: DC
  Administered 2018-01-18 – 2018-01-23 (×10): 0.5 mg via ORAL
  Filled 2018-01-18 (×10): qty 1

## 2018-01-18 NOTE — Plan of Care (Signed)
Sleeping most of shift  Aware of [place] Education . Patient is not attending unit programing . No anger outburst  This shift . Patient able to demonstrate  Self  Control Patient  Can  List positive feeling  Feeling of self .   Progressing Activity: Sleeping patterns will improve 01/18/2018 1805 - Progressing by Crist InfanteFarrish, Aziyah Provencal A, RN 01/18/2018 1642 - Not Progressing by Crist InfanteFarrish, Velma Agnes A, RN Education: Knowledge of [place] [facility] Education information/materials will improve 01/18/2018 1805 - Progressing by Crist InfanteFarrish, Nanami Whitelaw A, RN 01/18/2018 1642 - Progressing by Crist InfanteFarrish, Omero Kowal A, RN Coping: Ability to verbalize frustrations and anger appropriately will improve 01/18/2018 1805 - Progressing by Crist InfanteFarrish, Ivianna Notch A, RN 01/18/2018 1642 - Progressing by Crist InfanteFarrish, Rakeya Glab A, RN Ability to demonstrate self-control will improve 01/18/2018 1805 - Progressing by Crist InfanteFarrish, Khayman Kirsch A, RN 01/18/2018 1642 - Progressing by Crist InfanteFarrish, Hoyt Leanos A, RN Education: Ability to state activities that reduce stress will improve 01/18/2018 1805 - Progressing by Crist InfanteFarrish, Laiana Fratus A, RN 01/18/2018 1642 - Progressing by Crist InfanteFarrish, Bryar Dahms A, RN Self-Concept: Ability to verbalize positive feelings about self will improve 01/18/2018 1805 - Progressing by Crist InfanteFarrish, Avyn Aden A, RN 01/18/2018 1642 - Progressing by Crist InfanteFarrish, Aniken Monestime A, RN Pain Managment: General experience of comfort will improve 01/18/2018 1805 - Progressing by Crist InfanteFarrish, Marialuisa Basara A, RN 01/18/2018 1642 - Progressing by Crist InfanteFarrish, Nataliee Shurtz A, RN

## 2018-01-18 NOTE — B[facility] Group Notes (Signed)
LCSW Group Therapy Note  01/18/2018 1:00 pm  Type of Therapy/Topic:  Group Therapy:  Emotion Regulation  Participation Level:  Minimal   Description of Group:    The purpose of this group is to assist patients in learning to regulate negative emotions and experience positive emotions. Patients will be guided to discuss ways in which they have been vulnerable to their negative emotions. These vulnerabilities will be juxtaposed with experiences of positive emotions or situations, and patients will be challenged to use positive emotions to combat negative ones. Special emphasis will be placed on coping with negative emotions in conflict situations, and patients will process healthy conflict resolution skills.  Therapeutic Goals: 1. Patient will identify two positive emotions or experiences to reflect on in order to balance out negative emotions 2. Patient will label two or more emotions that they find the most difficult to experience 3. Patient will demonstrate positive conflict resolution skills through discussion and/or role plays  Summary of Patient Progress:  Annette Garrison was able to participate some in today's group.  She engaged in identifying the positive and negative emotions that she has felt to include, "excited", "kind", "depressed", and "disappointed".  Annette Garrison did not choose to engage in deeper discussion that included her giving examples to how she works through her negative emotions in order to have more emotional regulation.  She did not wish to share coping strategies that she has used with negative emotions in conflict situations.      Therapeutic Modalities:   Cognitive Behavioral Therapy Feelings Identification Dialectical Behavioral Therapy

## 2018-01-18 NOTE — Plan of Care (Signed)
Patient is isolative in her room stating wanting to rest for a while and feeling cold, room is warm, temperature adjusted, other wise the say she is doing fine, no distress, and patient is safe and contract for safety. Progressing Activity: Sleeping patterns will improve 01/18/2018 2027 - Progressing by Lelan PonsAriwodo, Berneice Zettlemoyer, RN Coping: Ability to verbalize frustrations and anger appropriately will improve 01/18/2018 2027 - Progressing by Lelan PonsAriwodo, Edom Schmuhl, RN Ability to demonstrate self-control will improve 01/18/2018 2027 - Progressing by Lelan PonsAriwodo, Keydi Giel, RN Education: Ability to state activities that reduce stress will improve 01/18/2018 2027 - Progressing by Lelan PonsAriwodo, Adron Geisel, RN Self-Concept: Ability to verbalize positive feelings about self will improve 01/18/2018 2027 - Progressing by Lelan PonsAriwodo, Avalina Benko, RN

## 2018-01-18 NOTE — Plan of Care (Signed)
Sleeping most of shift  Aware of [place] Education . Patient is not attending unit programing . No anger outburst  This shift . Patient able to demonstrate  Self  Control Patient  Can  List positive feeling  Feeling of self .

## 2018-01-18 NOTE — Progress Notes (Signed)
D: Patient stated slept good last night .Stated appetite fair and energy level  Is normal. Stated concentration is good . Stated on Depression scale 5 , hopeless  0 and anxiety 3 .( low 0-10 high) Denies suicidal  homicidal ideations  .  No auditory hallucinations  No pain concerns . Appropriate ADL'S. Interacting with peers and staff.  Sleeping most of shift  Aware of [place] Education . Patient is not attending unit programing . No anger outburst  This shift . Patient able to demonstrate  Self  Control Patient  Can  List positive feeling  Feeling of self .  A: Encourage patient participation with unit programming . Instruction  Given on  Medication , verbalize understanding. R: Voice no other concerns. Staff continue to monitor

## 2018-01-18 NOTE — Progress Notes (Signed)
[facility] MD Progress Note  01/18/2018 3:29 PM Tionne Cherylann Hobday  MRN:  [id number]   Subjective:  Pt states that she is improving each day. She reports that voices are improving daily. They are still present but improving with medications. She feels Haldol is helping a lot. She is willing to decrease Seroquel today to avoid polypharmacy. She denies SI or any thoughts of self harm. NO delusional thought content today. She plans to either stay with her aunt or other friends. She is hesitant to have [place] call her aunt. She states, "I'll figure it out. I got a place to stay." She has been very calm and pleasant on the unit.   Principal Problem: Bipolar I disorder, single manic episode, severe, with psychosis (HCC) Diagnosis:   Patient Active Problem List   Diagnosis Date Noted  . Bipolar I disorder, single manic episode, severe, with psychosis (HCC) [F30.2] 01/14/2018  . Cannabis abuse [F12.10]   . Vitamin B12 deficiency [E53.8] 06/08/2016  . Cocaine use disorder, moderate, dependence (HCC) [F14.20] 06/07/2016  . Tobacco use disorder [F17.200] 06/07/2016  . Anemia [D64.9] 06/07/2016  . Bipolar I disorder, current or most recent episode manic, with psychotic features (HCC) [F31.2] 06/06/2016  . Cocaine abuse (HCC) [F14.10] 08/04/2015  . Dysmenorrhea [N94.6] 08/04/2015  . Hypertension [I10] 08/04/2015   Total Time spent with patient: 20 minutes  Past Psychiatric History: See H&P  Past Medical History:  Past Medical History:  Diagnosis Date  . Anemia   . Bipolar disorder (HCC)   . Hypertension     Past Surgical History:  Procedure Laterality Date  . CESAREAN SECTION     Family History: History reviewed. No pertinent family history. Family Psychiatric  History: See H&P Social History:  Social History   Substance and Sexual Activity  Alcohol Use No     Social History   Substance and Sexual Activity  Drug Use Yes  . Types: Marijuana, Cocaine    Social History   Socioeconomic  History  . Marital status: Married    Spouse name: None  . Number of children: None  . Years of education: None  . Highest education level: None  Social Needs  . Financial resource strain: None  . Food insecurity - worry: None  . Food insecurity - inability: None  . Transportation needs - medical: None  . Transportation needs - non-medical: None  Occupational History  . None  Tobacco Use  . Smoking status: Current Some Day Smoker    Packs/day: 2.00    Types: Cigarettes  . Smokeless tobacco: Never Used  Substance and Sexual Activity  . Alcohol use: No  . Drug use: Yes    Types: Marijuana, Cocaine  . Sexual activity: Not Currently  Other Topics Concern  . None  Social History Narrative  . None   Additional Social History:                         Sleep: Good  Appetite:  Good  Current Medications: Current Facility-Administered Medications  Medication Dose Route Frequency Provider Last Rate Last Dose  . acetaminophen (TYLENOL) tablet 650 mg  650 mg Oral Q6H PRN Clapacs, Jackquline Denmark, MD   650 mg at 01/15/18 2152  . alum & mag hydroxide-simeth (MAALOX/MYLANTA) 200-200-20 MG/5ML suspension 30 mL  30 mL Oral Q4H PRN Clapacs, John T, MD   30 mL at 01/15/18 1323  . cyanocobalamin ((VITAMIN B-12)) injection 1,000 mcg  1,000 mcg Intramuscular Daily Lakima Dona,  Ileene Hutchinson, MD   1,000 mcg at 01/18/18 [phone]  . ferrous sulfate tablet 325 mg  325 mg Oral BID WC Darliss Ridgel, MD   325 mg at 01/18/18 0816  . haloperidol (HALDOL) tablet 5 mg  5 mg Oral Daily Durk Carmen, Ileene Hutchinson, MD   5 mg at 01/18/18 1478  . hydrOXYzine (ATARAX/VISTARIL) tablet 25 mg  25 mg Oral TID PRN Clapacs, Jackquline Denmark, MD      . loperamide (IMODIUM) capsule 2 mg  2 mg Oral PRN Darliss Ridgel, MD      . magnesium hydroxide (MILK OF MAGNESIA) suspension 30 mL  30 mL Oral Daily PRN Clapacs, John T, MD      . ondansetron (ZOFRAN-ODT) disintegrating tablet 4 mg  4 mg Oral Q8H PRN Darliss Ridgel, MD      . QUEtiapine (SEROQUEL) tablet  150 mg  150 mg Oral QHS Darliss Ridgel, MD   150 mg at 01/17/18 2056  . traZODone (DESYREL) tablet 100 mg  100 mg Oral QHS PRN Clapacs, Jackquline Denmark, MD   100 mg at 01/14/18 2051  . [START ON 01/20/2018] vitamin B-12 (CYANOCOBALAMIN) tablet 1,000 mcg  1,000 mcg Oral Daily Dakai Braithwaite, Ileene Hutchinson, MD        Lab Results: No results found for this or any previous visit (from the past 48 hour(s)).  Blood Alcohol level:  Lab Results  Component Value Date   ETH <10 01/12/2018   ETH <5 06/05/2016    Metabolic Disorder Labs: Lab Results  Component Value Date   HGBA1C 5.4 01/15/2018   MPG 108.28 01/15/2018   Lab Results  Component Value Date   PROLACTIN 24.4 (H) 06/08/2016   Lab Results  Component Value Date   CHOL 163 01/15/2018   TRIG 82 01/15/2018   HDL 39 (L) 01/15/2018   CHOLHDL 4.2 01/15/2018   VLDL 16 01/15/2018   LDLCALC 108 (H) 01/15/2018   LDLCALC 93 06/08/2016    Physical Findings: AIMS:  , ,  ,  ,    CIWA:  CIWA-Ar Total: 0 COWS:     Musculoskeletal: Strength & Muscle Tone: within normal limits Gait & Station: normal Patient leans: N/A  Psychiatric Specialty Exam: Physical Exam  Nursing note and vitals reviewed. Some very mild possible cogwheeling on exam  Review of Systems  Cardiovascular: Negative for chest pain.  Musculoskeletal: Negative for myalgias.  All other systems reviewed and are negative. Denies muscle stiffness, difficulty swallowing  Blood pressure 115/78, pulse 90, temperature 98 F (36.7 C), temperature source Oral, resp. rate 18, height 5\' 3"  (1.6 m), weight 71.2 kg (157 lb), SpO2 100 %.Body mass index is 27.81 kg/m.  General Appearance: Casual  Eye Contact:  Minimal  Speech:  Clear and Coherent  Volume:  Normal  Mood:  Euthymic  Affect:  Congruent  Thought Process:  Coherent and Goal Directed  Orientation:  Full (Time, Place, and Person)  Thought Content:  Logical, some lingering AH  Suicidal Thoughts:  No  Homicidal Thoughts:  No  Memory:   Immediate;   Fair  Judgement:  Fair  Insight:  Fair  Psychomotor Activity:  Normal  Concentration:  Concentration: Fair  Recall:  [employer] of Knowledge:  Fair  Language:  Fair  Akathisia:  No      Assets:  Communication Skills  ADL's:  Intact  Cognition:  WNL  Sleep:  Number of Hours: 6     Treatment Plan Summary: 42 yo female admitted  due to psychosis and delusions. AH are improving. She has been very calm and pleasant on the unit. She is hesitant to allow up to call her aunt.   Plan:  Bipolar disorder vs. Schizoaffective disorder -Decrease to 100 mg qhs. She is hesitant to stop but willing to decrease since starting Haldol -Continue Haldol 5 mg daily -EKG normal with QTc at 437 -Start Cogentin 0.5 mg BID   HTN -BP stable off medications  Low B12 -Replace with IM and oral   Dispo -Pt will either stay with aunt or friends on discharge. She states that she will contact her soon. She will follow up with [facility]. She has not signed consent for us to talk to her aunt and is hesitant to have us contact her at this time    Haskell RilingHolly R Kashmir Lysaght, MD 01/18/2018, 3:29 PM

## 2018-01-19 MED ORDER — HALOPERIDOL 5 MG PO TABS: 5.0000 mg | ORAL_TABLET | Freq: Every day | ORAL | Status: DC

## 2018-01-19 MED ORDER — HALOPERIDOL 1 MG PO TABS: 2.0000 mg | ORAL_TABLET | Freq: Once | ORAL | Status: DC

## 2018-01-19 MED ORDER — HALOPERIDOL 5 MG PO TABS
5.0000 mg | ORAL_TABLET | ORAL | Status: DC
  Administered 2018-01-20 – 2018-01-23 (×4): 5 mg via ORAL
  Filled 2018-01-19 (×3): qty 1

## 2018-01-19 MED ORDER — HALOPERIDOL 1 MG PO TABS: 2.0000 mg | ORAL_TABLET | Freq: Every day | ORAL | Status: DC

## 2018-01-19 MED ORDER — HALOPERIDOL 1 MG PO TABS
2.0000 mg | ORAL_TABLET | Freq: Every day | ORAL | Status: DC
  Administered 2018-01-19 – 2018-01-22 (×4): 2 mg via ORAL
  Filled 2018-01-19 (×4): qty 2

## 2018-01-19 MED ORDER — CYANOCOBALAMIN 1000 MCG/ML IJ SOLN
1000.0000 ug | Freq: Once | INTRAMUSCULAR | Status: AC
  Administered 2018-01-19: 1000 ug via INTRAMUSCULAR

## 2018-01-19 NOTE — Progress Notes (Signed)
[facility] MD Progress Note  01/19/2018 2:20 PM Annette Garrison  MRN:  [id number] Subjective:  Pt is sleeping most of the morning. She states taht she is doing okay. She feels the hallucinations are improving each day. She does not hear them currently. She slept well with decreased dose of Seroquel. She has been calm on the unit but isolative to her room. Denies SI or HI. She is wanting to stay with her aunt on discharge.   Principal Problem: Bipolar I disorder, single manic episode, severe, with psychosis (HCC) Diagnosis:   Patient Active Problem List   Diagnosis Date Noted  . Bipolar I disorder, single manic episode, severe, with psychosis (HCC) [F30.2] 01/14/2018    Priority: High  . Cannabis abuse [F12.10]   . Vitamin B12 deficiency [E53.8] 06/08/2016  . Cocaine use disorder, moderate, dependence (HCC) [F14.20] 06/07/2016  . Tobacco use disorder [F17.200] 06/07/2016  . Anemia [D64.9] 06/07/2016  . Bipolar I disorder, current or most recent episode manic, with psychotic features (HCC) [F31.2] 06/06/2016  . Cocaine abuse (HCC) [F14.10] 08/04/2015  . Dysmenorrhea [N94.6] 08/04/2015  . Hypertension [I10] 08/04/2015   Total Time spent with patient: 15 minutes  Past Psychiatric History: See H&P  Past Medical History:  Past Medical History:  Diagnosis Date  . Anemia   . Bipolar disorder (HCC)   . Hypertension     Past Surgical History:  Procedure Laterality Date  . CESAREAN SECTION     Family History: History reviewed. No pertinent family history. Family Psychiatric  History: See H&P Social History:  Social History   Substance and Sexual Activity  Alcohol Use No     Social History   Substance and Sexual Activity  Drug Use Yes  . Types: Marijuana, Cocaine    Social History   Socioeconomic History  . Marital status: Married    Spouse name: None  . Number of children: None  . Years of education: None  . Highest education level: None  Social Needs  . Financial  resource strain: None  . Food insecurity - worry: None  . Food insecurity - inability: None  . Transportation needs - medical: None  . Transportation needs - non-medical: None  Occupational History  . None  Tobacco Use  . Smoking status: Current Some Day Smoker    Packs/day: 2.00    Types: Cigarettes  . Smokeless tobacco: Never Used  Substance and Sexual Activity  . Alcohol use: No  . Drug use: Yes    Types: Marijuana, Cocaine  . Sexual activity: Not Currently  Other Topics Concern  . None  Social History Narrative  . None   Additional Social History:                         Sleep: Good  Appetite:  Good  Current Medications: Current Facility-Administered Medications  Medication Dose Route Frequency Provider Last Rate Last Dose  . acetaminophen (TYLENOL) tablet 650 mg  650 mg Oral Q6H PRN Clapacs, Jackquline Denmark, MD   650 mg at 01/18/18 2107  . alum & mag hydroxide-simeth (MAALOX/MYLANTA) 200-200-20 MG/5ML suspension 30 mL  30 mL Oral Q4H PRN Clapacs, John T, MD   30 mL at 01/15/18 1323  . benztropine (COGENTIN) tablet 0.5 mg  0.5 mg Oral BID Haskell Riling, MD   0.5 mg at 01/19/18 0823  . ferrous sulfate tablet 325 mg  325 mg Oral BID WC Darliss Ridgel, MD   325 mg at  01/19/18 0824  . haloperidol (HALDOL) tablet 2 mg  2 mg Oral QHS Merel Santoli R, MD      . haloperidol (HALDOL) tablet 5 mg  5 mg Oral Daily Gahel Safley, Ileene Hutchinson, MD   5 mg at 01/19/18 4098  . hydrOXYzine (ATARAX/VISTARIL) tablet 25 mg  25 mg Oral TID PRN Clapacs, Jackquline Denmark, MD      . loperamide (IMODIUM) capsule 2 mg  2 mg Oral PRN Darliss Ridgel, MD      . magnesium hydroxide (MILK OF MAGNESIA) suspension 30 mL  30 mL Oral Daily PRN Clapacs, John T, MD      . ondansetron (ZOFRAN-ODT) disintegrating tablet 4 mg  4 mg Oral Q8H PRN Darliss Ridgel, MD      . traZODone (DESYREL) tablet 100 mg  100 mg Oral QHS PRN Clapacs, Jackquline Denmark, MD   100 mg at 01/14/18 2051  . [START ON 01/20/2018] vitamin B-12 (CYANOCOBALAMIN) tablet  1,000 mcg  1,000 mcg Oral Daily Keisuke Hollabaugh, Ileene Hutchinson, MD        Lab Results: No results found for this or any previous visit (from the past 48 hour(s)).  Blood Alcohol level:  Lab Results  Component Value Date   ETH <10 01/12/2018   ETH <5 06/05/2016    Metabolic Disorder Labs: Lab Results  Component Value Date   HGBA1C 5.4 01/15/2018   MPG 108.28 01/15/2018   Lab Results  Component Value Date   PROLACTIN 24.4 (H) 06/08/2016   Lab Results  Component Value Date   CHOL 163 01/15/2018   TRIG 82 01/15/2018   HDL 39 (L) 01/15/2018   CHOLHDL 4.2 01/15/2018   VLDL 16 01/15/2018   LDLCALC 108 (H) 01/15/2018   LDLCALC 93 06/08/2016    Physical Findings: AIMS:  , ,  ,  ,    CIWA:  CIWA-Ar Total: 0 COWS:     Musculoskeletal: Strength & Muscle Tone: within normal limits Gait & Station: normal Patient leans: N/A  Psychiatric Specialty Exam: Physical Exam  ROS  Blood pressure (!) 110/59, pulse 74, temperature 98 F (36.7 C), temperature source Oral, resp. rate 18, height 5\' 3"  (1.6 m), weight 71.2 kg (157 lb), SpO2 100 %.Body mass index is 27.81 kg/m.  General Appearance: Casual  Eye Contact:  Minimal  Speech:  Clear and Coherent  Volume:  Normal  Mood:  Euthymic  Affect:  Appropriate  Thought Process:  Coherent  Orientation:  Full (Time, Place, and Person)  Thought Content:  Hallucinations: Auditory  Suicidal Thoughts:  No  Homicidal Thoughts:  No  Memory:  Immediate;   Fair  Judgement:  Fair  Insight:  Fair  Psychomotor Activity:  Normal  Concentration:  Concentration: Fair  Recall:  [employer] of Knowledge:  Fair  Language:  Fair  Akathisia:  No      Assets:  Resilience  ADL's:  Intact  Cognition:  WNL  Sleep:  Number of Hours: 7     Treatment Plan Summary: 42 yo female admitted due to psychosis which is improving. Plan to d/c Seroquel and increase Haldol to avoid polypharmacy.  Plan:  Bipolar disorder vs. Schizoaffective disorder -Stop Seroquel to  avoid polypharmacy -Increase Haldol to 5 mg and 2 mg qhs -EKG normal with QTc at 437 -Continue Cogentin 0.5 mg BID   HTN -BP stable off medications  Low B12 -Replace with IM and oral   Dispo -Pt will either stay with aunt on discharge. She has given consent  to contact her aunt. She will follow up with [facility].      Haskell RilingHolly R Rodman Recupero, MD 01/19/2018, 2:20 PM

## 2018-01-19 NOTE — Progress Notes (Signed)
D:Patient continue  To  Isolate to her room . Voice of sever headache this shift. Patient did  Not come out for breakfast or lunch. Nor did she go to any programing . No bathing at this time during shift . Interacting with her peers once she did get up Appetite fair   Patient stated slept good last night .Denies suicidal  homicidal ideations  .  No auditory hallucinations  .   A: Encourage patient participation with unit programming . Instruction  Given on  Medication , verbalize understanding.  R: Voice no other concerns. Staff continue to monitor

## 2018-01-19 NOTE — [facility] Suicide Risk Assessment (Signed)
[facility] INPATIENT:  Family/Significant Other Suicide Prevention Education  Suicide Prevention Education:  Education Completed; Annette Garrison [phone]) has been identified by the patient as the family member/significant other with whom the patient will be residing, and identified as the person(s) who will aid the patient in the event of a mental health crisis (suicidal ideations/suicide attempt).  With written consent from the patient, the family member/significant other has been provided the following suicide prevention education, prior to the and/or following the discharge of the patient.  The suicide prevention education provided includes the following:  Suicide risk factors  Suicide prevention and interventions  National Suicide Hotline telephone number  [facility] assessment telephone number  [facility] Emergency Assistance 911  [facility] and/or [facility]idential Mobile Crisis [facility] telephone number  Request made of family/significant other to:  Remove weapons (e.g., guns, rifles, knives), all items previously/currently identified as safety concern.    Remove drugs/medications (over-the-counter, prescriptions, illicit drugs), all items previously/currently identified as a safety concern.  The family member/significant other verbalizes understanding of the suicide prevention education information provided.  The family member/significant other agrees to remove the items of safety concern listed above.  Annette FrameSonya S Kennita Pavlovich, LCSW 01/19/2018, 11:18 AM

## 2018-01-19 NOTE — Plan of Care (Signed)
Patient is calm and responding well to treatment, patient states I'm feeling better, no safety concerns, takes her medicine and sleep long hours without any interruptions 15 minute safety round is in progress. Progressing Activity: Sleeping patterns will improve 01/19/2018 1940 - Progressing by Lelan PonsAriwodo, Shernell Saldierna, RN Education: Knowledge of [facility] Education information/materials will improve 01/19/2018 1940 - Progressing by Lelan PonsAriwodo, Sunya Humbarger, RN Coping: Ability to verbalize frustrations and anger appropriately will improve 01/19/2018 1940 - Progressing by Lelan PonsAriwodo, Lorrain Rivers, RN Education: Ability to state activities that reduce stress will improve 01/19/2018 1940 - Progressing by Lelan PonsAriwodo, Ellianah Cordy, RN Self-Concept: Ability to verbalize positive feelings about self will improve 01/19/2018 1940 - Progressing by Lelan PonsAriwodo, Marijean Montanye, RN Pain Managment: General experience of comfort will improve 01/19/2018 1940 - Progressing by Lelan PonsAriwodo, Nazareth Kirk, RN

## 2018-01-19 NOTE — [facility] Group Notes (Signed)
  01/19/2018 9am  Type of Therapy and Topic: Group Therapy: Goals Group: SMART Goals   Participation Level: Did Not Attend  Description of Group:    The purpose of a daily goals group is to assist and guide patients in setting recovery/wellness-related goals. The objective is to set goals as they relate to the crisis in which they were admitted. Patients will be using SMART goal modalities to set measurable goals. Characteristics of realistic goals will be discussed and patients will be assisted in setting and processing how one will reach their goal. Facilitator will also assist patients in applying interventions and coping skills learned in psycho-education groups to the SMART goal and process how one will achieve defined goal.   Therapeutic Goals:   -Patients will develop and document one goal related to or their crisis in which brought them into treatment.  -Patients will be guided by LCSW using SMART goal setting modality in how to set a measurable, attainable, realistic and time sensitive goal.  -Patients will process barriers in reaching goal.  -Patients will process interventions in how to overcome and successful in reaching goal.   Patient's Goal: Patient was encouraged and invited to attend group. Patient did not attend group. Social worker will continue to encourage group participation in the future.    Therapeutic Modalities:  Motivational Interviewing  Cognitive Behavioral Therapy  Crisis Intervention Model  SMART goals setting  Johny ShearsCassandra  Patsy Zaragoza, [place]CS[place] 01/19/2018 9:41 AM

## 2018-01-19 NOTE — Plan of Care (Signed)
   Progressing Activity: Sleeping patterns will improve 01/19/2018 1721 - Progressing by Crist InfanteFarrish, Missie Gehrig A, RN Education: Knowledge of [facility] Education information/materials will improve 01/19/2018 1721 - Progressing by Crist InfanteFarrish, Odester Nilson A, RN Coping: Ability to verbalize frustrations and anger appropriately will improve 01/19/2018 1721 - Progressing by Crist InfanteFarrish, Braxtyn Dorff A, RN Ability to demonstrate self-control will improve 01/19/2018 1721 - Progressing by Crist InfanteFarrish, Elwood Bazinet A, RN Education: Ability to state activities that reduce stress will improve 01/19/2018 1721 - Progressing by Crist InfanteFarrish, Nevin Kozuch A, RN Self-Concept: Ability to verbalize positive feelings about self will improve 01/19/2018 1721 - Progressing by Crist InfanteFarrish, Refugia Laneve A, RN Pain Managment: General experience of comfort will improve  01/19/2018 1721 - Progressing by Crist InfanteFarrish, Trevon Strothers A, RN  Patient continue to sleep through meals on the  unit . Verbalize understanding of information  received . No anger outburst  or control  issues noted.  Instructed  on breathing  to help relieve  stress  Able to state  positive feeling of self

## 2018-01-19 NOTE — [facility] Group Notes (Signed)
01/19/2018  Time: 1:00PM  Type of Therapy/Topic:  Group Therapy:  Balance in Life  Participation Level:  Did Not Attend  Description of Group:   This group will address the concept of balance and how it feels and looks when one is unbalanced. Patients will be encouraged to process areas in their lives that are out of balance and identify reasons for remaining unbalanced. Facilitators will guide patients in utilizing problem-solving interventions to address and correct the stressor making their life unbalanced. Understanding and applying boundaries will be explored and addressed for obtaining and maintaining a balanced life. Patients will be encouraged to explore ways to assertively make their unbalanced needs known to significant others in their lives, using other group members and facilitator for support and feedback.  Therapeutic Goals: 1. Patient will identify two or more emotions or situations they have that consume much of in their lives. 2. Patient will identify signs/triggers that life has become out of balance:  3. Patient will identify two ways to set boundaries in order to achieve balance in their lives:  4. Patient will demonstrate ability to communicate their needs through discussion and/or role plays  Summary of Patient Progress: Pt was invited to attend group but chose not to attend. CSW will continue to encourage pt to attend group throughout their admission.    Therapeutic Modalities:   Cognitive Behavioral Therapy Solution-Focused Therapy Assertiveness Training  Heidi DachKelsey Tomekia Helton, MSW, LCSW 01/19/2018 1:50 PM

## 2018-01-20 MED ORDER — FERROUS SULFATE 325 (65 FE) MG PO TABS: 325.0000 mg | ORAL_TABLET | Freq: Two times a day (BID) | ORAL | 1 refills | Status: DC

## 2018-01-20 MED ORDER — BENZTROPINE MESYLATE 0.5 MG PO TABS: 0.5000 mg | ORAL_TABLET | Freq: Two times a day (BID) | ORAL | 1 refills | Status: DC

## 2018-01-20 MED ORDER — HALOPERIDOL 5 MG PO TABS: 5.0000 mg | ORAL_TABLET | ORAL | 1 refills | Status: DC

## 2018-01-20 MED ORDER — TRAZODONE HCL 100 MG PO TABS: 100.0000 mg | ORAL_TABLET | Freq: Every evening | ORAL | 1 refills | Status: DC | PRN

## 2018-01-20 MED ORDER — HALOPERIDOL 2 MG PO TABS: 2.0000 mg | ORAL_TABLET | Freq: Every day | ORAL | 1 refills | Status: DC

## 2018-01-20 NOTE — Progress Notes (Signed)
[facility] MD Progress Note  01/20/2018 11:42 AM Annette Garrison  MRN:  [id number] Subjective:  Pt doing much better today. She is much less sedated since stopped Seroquel last night. She is out on the unit and socializing with peers. She states that the voices are "very minimal and dim." She spoke with her aunt yesterday and they had a good conversation. She also states that she met with Lanae Boast with [facility] and plans to go to groups 3 x a week with them. She feels the medications are very helpful for her. She slept well last night. She states taht she has never properly grieved the death of her uncle and has been building up. She is appropriately tearful when talking about him. She states taht she feels more clearminded and well rested. Denies any muscle stiffness. No stiffness or cogwheel noted on exam.   Principal Problem: Bipolar I disorder, single manic episode, severe, with psychosis ([place]) Diagnosis:   Patient Active Problem List   Diagnosis Date Noted  . Bipolar I disorder, single manic episode, severe, with psychosis ([place]) [F30.2] 01/14/2018    Priority: High  . Cannabis abuse [F12.10]   . Vitamin B12 deficiency [E53.8] 06/08/2016  . Cocaine use disorder, moderate, dependence ([place]) [F14.20] 06/07/2016  . Tobacco use disorder [F17.200] 06/07/2016  . Anemia [D64.9] 06/07/2016  . Bipolar I disorder, current or most recent episode manic, with psychotic features ([place]) [F31.2] 06/06/2016  . Cocaine abuse ([place]) [F14.10] 08/04/2015  . Dysmenorrhea [N94.6] 08/04/2015  . Hypertension [I10] 08/04/2015   Total Time spent with patient: 20 minutes  Past Psychiatric History: See H&P  Past Medical History:  Past Medical History:  Diagnosis Date  . Anemia   . Bipolar disorder ([place])   . Hypertension     Past Surgical History:  Procedure Laterality Date  . CESAREAN SECTION     Family History: History reviewed. No pertinent family history. Family Psychiatric  History: See H&P Social History:   Social History   Substance and Sexual Activity  Alcohol Use No     Social History   Substance and Sexual Activity  Drug Use Yes  . Types: Marijuana, Cocaine    Social History   Socioeconomic History  . Marital status: Married    Spouse name: None  . Number of children: None  . Years of education: None  . Highest education level: None  Social Needs  . Financial resource strain: None  . Food insecurity - worry: None  . Food insecurity - inability: None  . Transportation needs - medical: None  . Transportation needs - non-medical: None  Occupational History  . None  Tobacco Use  . Smoking status: Current Some Day Smoker    Packs/day: 2.00    Types: Cigarettes  . Smokeless tobacco: Never Used  Substance and Sexual Activity  . Alcohol use: No  . Drug use: Yes    Types: Marijuana, Cocaine  . Sexual activity: Not Currently  Other Topics Concern  . None  Social History Narrative  . None   Additional Social History:                         Sleep: Good  Appetite:  Good  Current Medications: Current Facility-Administered Medications  Medication Dose Route Frequency Provider Last Rate Last Dose  . acetaminophen (TYLENOL) tablet 650 mg  650 mg Oral Q6H PRN Clapacs, Madie Reno, MD   650 mg at 01/19/18 2110  . alum & mag hydroxide-simeth (  MAALOX/MYLANTA) 200-200-20 MG/5ML suspension 30 mL  30 mL Oral Q4H PRN Clapacs, John T, MD   30 mL at 01/15/18 1323  . benztropine (COGENTIN) tablet 0.5 mg  0.5 mg Oral BID Marylin Crosby, MD   0.5 mg at 01/20/18 0829  . ferrous sulfate tablet 325 mg  325 mg Oral BID WC Chauncey Mann, MD   325 mg at 01/20/18 0829  . haloperidol (HALDOL) tablet 2 mg  2 mg Oral QHS McNew, Holly R, MD   2 mg at 01/19/18 2110  . haloperidol (HALDOL) tablet 5 mg  5 mg Oral BH-q7a McNew, Tyson Babinski, MD   5 mg at 01/20/18 5400  . hydrOXYzine (ATARAX/VISTARIL) tablet 25 mg  25 mg Oral TID PRN Clapacs, Madie Reno, MD   25 mg at 01/19/18 1647  . loperamide  (IMODIUM) capsule 2 mg  2 mg Oral PRN Chauncey Mann, MD      . magnesium hydroxide (MILK OF MAGNESIA) suspension 30 mL  30 mL Oral Daily PRN Clapacs, John T, MD      . ondansetron (ZOFRAN-ODT) disintegrating tablet 4 mg  4 mg Oral Q8H PRN Chauncey Mann, MD      . traZODone (DESYREL) tablet 100 mg  100 mg Oral QHS PRN Clapacs, Madie Reno, MD   100 mg at 01/20/18 0222  . vitamin B-12 (CYANOCOBALAMIN) tablet 1,000 mcg  1,000 mcg Oral Daily McNew, Tyson Babinski, MD   1,000 mcg at 01/20/18 8676    Lab Results: No results found for this or any previous visit (from the past 48 hour(s)).  Blood Alcohol level:  Lab Results  Component Value Date   ETH <10 01/12/2018   ETH <5 19/50/9326    Metabolic Disorder Labs: Lab Results  Component Value Date   HGBA1C 5.4 01/15/2018   MPG 108.28 01/15/2018   Lab Results  Component Value Date   PROLACTIN 24.4 (H) 06/08/2016   Lab Results  Component Value Date   CHOL 163 01/15/2018   TRIG 82 01/15/2018   HDL 39 (L) 01/15/2018   CHOLHDL 4.2 01/15/2018   VLDL 16 01/15/2018   LDLCALC 108 (H) 01/15/2018   LDLCALC 93 06/08/2016    Physical Findings: AIMS:  , ,  ,  ,    CIWA:  CIWA-Ar Total: 0 COWS:     Musculoskeletal: Strength & Muscle Tone: within normal limits Gait & Station: normal Patient leans: N/A  Psychiatric Specialty Exam: Physical Exam  Nursing note and vitals reviewed.   Review of Systems  All other systems reviewed and are negative.   Blood pressure 122/76, pulse 89, temperature 98.8 F (37.1 C), temperature source Oral, resp. rate 18, height 5' 3"  (1.6 m), weight 71.2 kg (157 lb), SpO2 98 %.Body mass index is 27.81 kg/m.  General Appearance: Casual  Eye Contact:  Good  Speech:  Clear and Coherent  Volume:  Normal  Mood:  Sad  Affect:  Tearful  Thought Process:  Coherent and Goal Directed  Orientation:  Full (Time, Place, and Person)  Thought Content:  Logical  Suicidal Thoughts:  No  Homicidal Thoughts:  No  Memory:   Immediate;   Fair  Judgement:  Fair  Insight:  Fair  Psychomotor Activity:  Normal  Concentration:  Concentration: Fair  Recall:  [employer] of Knowledge:  Fair  Language:  Fair  Akathisia:  No      Assets:  Communication Skills Desire for Improvement Resilience  ADL's:  Intact  Cognition:  WNL  Sleep:  Number of Hours: 5.15     Treatment Plan Summary: 42 yo female admitted due to psychosis. She is tolerating Haldol and has improved significantly. She is much more organized and voices are improving. She is much less sedated since stopping Seroquel.   Plan:  Psychosis -Continue Haldol 5 mg qam and 2 mg qhs.  -Continue Cogentin 0.5 mg BID  Dispo -She will live with aunt on discharge. She will follow up with [place], MD 01/20/2018, 11:42 AM

## 2018-01-20 NOTE — Tx Team (Signed)
Interdisciplinary Treatment and Diagnostic Plan Update  01/20/2018 Time of Session: 11:00am Cyntha Markeesha Char MRN: [id number]  Principal Diagnosis: Bipolar I disorder, single manic episode, severe, with psychosis (HCC)  Secondary Diagnoses: Principal Problem:   Bipolar I disorder, single manic episode, severe, with psychosis (HCC) Active Problems:   Cocaine abuse (HCC)   Cannabis abuse   Current Medications:  Current Facility-Administered Medications  Medication Dose Route Frequency Provider Last Rate Last Dose  . acetaminophen (TYLENOL) tablet 650 mg  650 mg Oral Q6H PRN Clapacs, Jackquline Denmark, MD   650 mg at 01/19/18 2110  . alum & mag hydroxide-simeth (MAALOX/MYLANTA) 200-200-20 MG/5ML suspension 30 mL  30 mL Oral Q4H PRN Clapacs, John T, MD   30 mL at 01/15/18 1323  . benztropine (COGENTIN) tablet 0.5 mg  0.5 mg Oral BID Haskell Riling, MD   0.5 mg at 01/20/18 0829  . ferrous sulfate tablet 325 mg  325 mg Oral BID WC Darliss Ridgel, MD   325 mg at 01/20/18 0829  . haloperidol (HALDOL) tablet 2 mg  2 mg Oral QHS McNew, Holly R, MD   2 mg at 01/19/18 2110  . haloperidol (HALDOL) tablet 5 mg  5 mg Oral BH-q7a McNew, Ileene Hutchinson, MD   5 mg at 01/20/18 6213  . hydrOXYzine (ATARAX/VISTARIL) tablet 25 mg  25 mg Oral TID PRN Clapacs, Jackquline Denmark, MD   25 mg at 01/19/18 1647  . loperamide (IMODIUM) capsule 2 mg  2 mg Oral PRN Darliss Ridgel, MD      . magnesium hydroxide (MILK OF MAGNESIA) suspension 30 mL  30 mL Oral Daily PRN Clapacs, John T, MD      . ondansetron (ZOFRAN-ODT) disintegrating tablet 4 mg  4 mg Oral Q8H PRN Darliss Ridgel, MD      . traZODone (DESYREL) tablet 100 mg  100 mg Oral QHS PRN Clapacs, Jackquline Denmark, MD   100 mg at 01/20/18 0222  . vitamin B-12 (CYANOCOBALAMIN) tablet 1,000 mcg  1,000 mcg Oral Daily McNew, Ileene Hutchinson, MD   1,000 mcg at 01/20/18 0865   PTA Medications: Medications Prior to Admission  Medication Sig Dispense Refill Last Dose  . metroNIDAZOLE (FLAGYL) 500 MG tablet Take 1  tablet (500 mg total) by mouth 2 (two) times daily. (Patient not taking: Reported on 01/12/2018) 14 tablet 0 Not Taking at Unknown time  . QUEtiapine (SEROQUEL) 100 MG tablet Take 1 tablet (100 mg total) by mouth at bedtime. (Patient not taking: Reported on 11/17/2016) 30 tablet 0 Not Taking at Unknown time  . vitamin B-12 1000 MCG tablet Take 1 tablet (1,000 mcg total) by mouth daily. (Patient not taking: Reported on 11/17/2016) 30 tablet 3 Not Taking at Unknown time    Patient Stressors: Financial difficulties Substance abuse  Patient Strengths: Ability for insight Motivation for treatment/growth Supportive family/friends  Treatment Modalities: Medication Management, Group therapy, Case management,  1 to 1 session with clinician, Psychoeducation, Recreational therapy.   Physician Treatment Plan for Primary Diagnosis: Bipolar I disorder, single manic episode, severe, with psychosis (HCC) Long Term Goal(s): Improvement in symptoms so as ready for discharge Improvement in symptoms so as ready for discharge   Short Term Goals: Ability to identify changes in lifestyle to reduce recurrence of condition will improve Ability to verbalize feelings will improve Ability to identify and develop effective coping behaviors will improve Ability to maintain clinical measurements within normal limits will improve Compliance with prescribed medications will improve Ability to identify triggers  associated with substance abuse/mental health issues will improve Ability to identify changes in lifestyle to reduce recurrence of condition will improve Ability to verbalize feelings will improve Ability to identify and develop effective coping behaviors will improve Ability to maintain clinical measurements within normal limits will improve Compliance with prescribed medications will improve Ability to identify triggers associated with substance abuse/mental health issues will improve  Medication Management:  Evaluate patient's response, side effects, and tolerance of medication regimen.  Therapeutic Interventions: 1 to 1 sessions, Unit Group sessions and Medication administration.  Evaluation of Outcomes: Progressing  Physician Treatment Plan for Secondary Diagnosis: Principal Problem:   Bipolar I disorder, single manic episode, severe, with psychosis (HCC) Active Problems:   Cocaine abuse (HCC)   Cannabis abuse  Long Term Goal(s): Improvement in symptoms so as ready for discharge Improvement in symptoms so as ready for discharge   Short Term Goals: Ability to identify changes in lifestyle to reduce recurrence of condition will improve Ability to verbalize feelings will improve Ability to identify and develop effective coping behaviors will improve Ability to maintain clinical measurements within normal limits will improve Compliance with prescribed medications will improve Ability to identify triggers associated with substance abuse/mental health issues will improve Ability to identify changes in lifestyle to reduce recurrence of condition will improve Ability to verbalize feelings will improve Ability to identify and develop effective coping behaviors will improve Ability to maintain clinical measurements within normal limits will improve Compliance with prescribed medications will improve Ability to identify triggers associated with substance abuse/mental health issues will improve     Medication Management: Evaluate patient's response, side effects, and tolerance of medication regimen.  Therapeutic Interventions: 1 to 1 sessions, Unit Group sessions and Medication administration.  Evaluation of Outcomes: Progressing   RN Treatment Plan for Primary Diagnosis: Bipolar I disorder, single manic episode, severe, with psychosis (HCC) Long Term Goal(s): Knowledge of disease and therapeutic regimen to maintain health will improve  Short Term Goals: Ability to participate in decision  making will improve, Ability to identify and develop effective coping behaviors will improve and Compliance with prescribed medications will improve  Medication Management: RN will administer medications as ordered by provider, will assess and evaluate patient's response and provide education to patient for prescribed medication. RN will report any adverse and/or side effects to prescribing provider.  Therapeutic Interventions: 1 on 1 counseling sessions, Psychoeducation, Medication administration, Evaluate responses to treatment, Monitor vital signs and CBGs as ordered, Perform/monitor CIWA, COWS, AIMS and Fall Risk screenings as ordered, Perform wound care treatments as ordered.  Evaluation of Outcomes: Progressing   LCSW Treatment Plan for Primary Diagnosis: Bipolar I disorder, single manic episode, severe, with psychosis (HCC) Long Term Goal(s): Safe transition to appropriate next level of care at discharge, Engage patient in therapeutic group addressing interpersonal concerns.  Short Term Goals: Engage patient in aftercare planning with referrals and resources, Identify triggers associated with mental health/substance abuse issues and Increase skills for wellness and recovery  Therapeutic Interventions: Assess for all discharge needs, 1 to 1 time with Social worker, Explore available resources and support systems, Assess for adequacy in community support network, Educate family and significant other(s) on suicide prevention, Complete Psychosocial Assessment, Interpersonal group therapy.  Evaluation of Outcomes: Progressing   Progress in Treatment: Attending groups: No. Participating in groups: No. Taking medication as prescribed: Yes. Toleration medication: Yes. Family/Significant other contact made: Yes, individual(s) contacted:  her aunt, Jethro PolingSara Clinkscale. Patient understands diagnosis: Yes. Discussing patient identified problems/goals with staff: Yes.  Medical problems stabilized or  resolved: Yes. Denies suicidal/homicidal ideation: Yes. Issues/concerns per patient self-inventory: No. Other:    New problem(s) identified: No, Describe:     New Short Term/Long Term Goal(s): To go home   Discharge Plan or Barriers: Tentative plan is for Alesia to discharge from hospital on January 23, 2018.  She will be returning to her aunt's home with follow-up outpatient services at [facility].  Reason for Continuation of Hospitalization: Medication stabilization  Estimated Length of Stay: 3 days  Attendees: Patient: 01/20/2018 2:13 PM  Physician: Corinna Gab, MD 01/20/2018 2:13 PM  Nursing: Bruce Donath, RN 01/20/2018 2:13 PM  RN Care Manager: 01/20/2018 2:13 PM  Social Worker: Huey Romans, LCSW 01/20/2018 2:13 PM  Recreational Therapist:  01/20/2018 2:13 PM  Other:  01/20/2018 2:13 PM  Other:  01/20/2018 2:13 PM  Other: 01/20/2018 2:13 PM    Scribe for Treatment Team: Alease Frame, LCSW 01/20/2018 2:13 PM

## 2018-01-20 NOTE — Plan of Care (Signed)
Patient verbalized that she was just tired otherwise she had a good day.Appropriate with staff & peers.Support and encouragement given.

## 2018-01-20 NOTE — [facility] Group Notes (Signed)
LCSW Group Therapy Note  01/20/2018 1:00 pm  Type of Therapy and Topic:  Group Therapy:  Feelings around Relapse and Recovery  Participation Level:  Did Not Attend   Description of Group:    Patients in this group will discuss emotions they experience before and after a relapse. They will process how experiencing these feelings, or avoidance of experiencing them, relates to having a relapse. Facilitator will guide patients to explore emotions they have related to recovery. Patients will be encouraged to process which emotions are more powerful. They will be guided to discuss the emotional reaction significant others in their lives may have to their relapse or recovery. Patients will be assisted in exploring ways to respond to the emotions of others without this contributing to a relapse.  Therapeutic Goals: 1. Patient will identify two or more emotions that lead to a relapse for them 2. Patient will identify two emotions that result when they relapse 3. Patient will identify two emotions related to recovery 4. Patient will demonstrate ability to communicate their needs through discussion and/or role plays   Summary of Patient Progress:     Therapeutic Modalities:   Cognitive Behavioral Therapy Solution-Focused Therapy Assertiveness Training Relapse Prevention Therapy   Alease FrameSonya S Tanuj Mullens, LCSW 01/20/2018 2:16 PM

## 2018-01-20 NOTE — [facility] Group Notes (Signed)
[facility] Group Notes:  (Nursing/MHT/Case Management/Adjunct)  Date:  01/20/2018  Time:  12:35 AM  Type of Therapy:  Group Therapy  Participation Level:  Did Not Attend   Annette Garrison 01/20/2018, 12:35 AM

## 2018-01-21 NOTE — Progress Notes (Signed)
Patient isolative to self and room. In no distress. Reports tired and c/o generalized pain. Medication given with partial relief. Comes out for meals is medication compliant. No psychotic episodes noted. Patient remains safe on unit with q 15 min checks.

## 2018-01-21 NOTE — [facility] Group Notes (Signed)
LCSW Group Therapy Note  01/21/2018 1:15pm  Type of Therapy and Topic: Group Therapy: Holding on to Grudges   Participation Level: Did Not Attend   Description of Group:  In this group patients will be asked to explore and define a grudge. Patients will be guided to discuss their thoughts, feelings, and reasons as to why people have grudges. Patients will process the impact grudges have on daily life and identify thoughts and feelings related to holding grudges. Facilitator will challenge patients to identify ways to let go of grudges and the benefits this provides. Patients will be confronted to address why one struggles letting go of grudges. Lastly, patients will identify feelings and thoughts related to what life would look like without grudges. This group will be process-oriented, with patients participating in exploration of their own experiences, giving and receiving support, and processing challenge from other group members.  Therapeutic Goals:  1. Patient will identify specific grudges related to their personal life.  2. Patient will identify feelings, thoughts, and beliefs around grudges.  3. Patient will identify how one releases grudges appropriately.  4. Patient will identify situations where they could have let go of the grudge, but instead chose to hold on.   Summary of Patient Progress:   Therapeutic Modalities:  Cognitive Behavioral Therapy  Solution Focused Therapy  Motivational Interviewing  Brief Therapy   Annette Garrison  CUEBAS-COLON, LCSW 01/21/2018 3:14 PM  

## 2018-01-21 NOTE — Plan of Care (Signed)
  Progressing Activity: Sleeping patterns will improve 01/21/2018 1719 - Progressing by Weldon Picking, RN Education: Knowledge of [facility] Education information/materials will improve 01/21/2018 1719 - Progressing by Weldon Picking, RN Coping: Ability to verbalize frustrations and anger appropriately will improve 01/21/2018 1719 - Progressing by Weldon Picking, RN Ability to demonstrate self-control will improve 01/21/2018 1719 - Progressing by Weldon Picking, RN Education: Ability to state activities that reduce stress will improve 01/21/2018 1719 - Progressing by Weldon Picking, RN Self-Concept: Ability to verbalize positive feelings about self will improve 01/21/2018 1719 - Progressing by Weldon Picking, RN Pain Managment: General experience of comfort will improve 01/21/2018 1719 - Progressing by Weldon Picking, RN

## 2018-01-21 NOTE — Progress Notes (Signed)
[facility] MD Progress Note  01/21/2018 12:21 PM Annette Garrison  MRN:  [id number] Subjective: Patient seen chart reviewed.  Patient with substance abuse problems and psychosis.  Symptoms much improved.  She was sleepy this morning but arousable and had no specific complaints.  Labs pretty much unremarkable.  Low B12 already being addressed by supplement.  Patient seems to be improving and tolerating medicine and ready for discharge soon. Principal Problem: Bipolar I disorder, single manic episode, severe, with psychosis (HCC) Diagnosis:   Patient Active Problem List   Diagnosis Date Noted  . Bipolar I disorder, single manic episode, severe, with psychosis (HCC) [F30.2] 01/14/2018  . Cannabis abuse [F12.10]   . Vitamin B12 deficiency [E53.8] 06/08/2016  . Cocaine use disorder, moderate, dependence (HCC) [F14.20] 06/07/2016  . Tobacco use disorder [F17.200] 06/07/2016  . Anemia [D64.9] 06/07/2016  . Bipolar I disorder, current or most recent episode manic, with psychotic features (HCC) [F31.2] 06/06/2016  . Cocaine abuse (HCC) [F14.10] 08/04/2015  . Dysmenorrhea [N94.6] 08/04/2015  . Hypertension [I10] 08/04/2015   Total Time spent with patient: 20 minutes  Past Psychiatric History: History of similar symptoms in the past especially when using cocaine.  Past Medical History:  Past Medical History:  Diagnosis Date  . Anemia   . Bipolar disorder (HCC)   . Hypertension     Past Surgical History:  Procedure Laterality Date  . CESAREAN SECTION     Family History: History reviewed. No pertinent family history. Family Psychiatric  History: None Social History:  Social History   Substance and Sexual Activity  Alcohol Use No     Social History   Substance and Sexual Activity  Drug Use Yes  . Types: Marijuana, Cocaine    Social History   Socioeconomic History  . Marital status: Married    Spouse name: None  . Number of children: None  . Years of education: None  . Highest  education level: None  Social Needs  . Financial resource strain: None  . Food insecurity - worry: None  . Food insecurity - inability: None  . Transportation needs - medical: None  . Transportation needs - non-medical: None  Occupational History  . None  Tobacco Use  . Smoking status: Current Some Day Smoker    Packs/day: 2.00    Types: Cigarettes  . Smokeless tobacco: Never Used  Substance and Sexual Activity  . Alcohol use: No  . Drug use: Yes    Types: Marijuana, Cocaine  . Sexual activity: Not Currently  Other Topics Concern  . None  Social History Narrative  . None   Additional Social History:                         Sleep: Good  Appetite:  Good  Current Medications: Current Facility-Administered Medications  Medication Dose Route Frequency Provider Last Rate Last Dose  . acetaminophen (TYLENOL) tablet 650 mg  650 mg Oral Q6H PRN [place] Marchiano, Jackquline Denmark, MD   650 mg at 01/21/18 0612  . alum & mag hydroxide-simeth (MAALOX/MYLANTA) 200-200-20 MG/5ML suspension 30 mL  30 mL Oral Q4H PRN Jamelyn Bovard T, MD   30 mL at 01/15/18 1323  . benztropine (COGENTIN) tablet 0.5 mg  0.5 mg Oral BID Haskell Riling, MD   0.5 mg at 01/21/18 4098  . ferrous sulfate tablet 325 mg  325 mg Oral BID WC Darliss Ridgel, MD   325 mg at 01/21/18 0838  . haloperidol (HALDOL)  tablet 2 mg  2 mg Oral QHS McNew, Holly R, MD   2 mg at 01/20/18 2125  . haloperidol (HALDOL) tablet 5 mg  5 mg Oral BH-q7a McNew, Ileene Hutchinson, MD   5 mg at 01/21/18 1610  . hydrOXYzine (ATARAX/VISTARIL) tablet 25 mg  25 mg Oral TID PRN Brighten Buzzelli, Jackquline Denmark, MD   25 mg at 01/19/18 1647  . loperamide (IMODIUM) capsule 2 mg  2 mg Oral PRN Darliss Ridgel, MD      . magnesium hydroxide (MILK OF MAGNESIA) suspension 30 mL  30 mL Oral Daily PRN Zyion Doxtater T, MD      . ondansetron (ZOFRAN-ODT) disintegrating tablet 4 mg  4 mg Oral Q8H PRN Darliss Ridgel, MD      . traZODone (DESYREL) tablet 100 mg  100 mg Oral QHS PRN Voyd Groft, Jackquline Denmark,  MD   100 mg at 01/20/18 2233  . vitamin B-12 (CYANOCOBALAMIN) tablet 1,000 mcg  1,000 mcg Oral Daily McNew, Ileene Hutchinson, MD   1,000 mcg at 01/21/18 9604    Lab Results: No results found for this or any previous visit (from the past 48 hour(s)).  Blood Alcohol level:  Lab Results  Component Value Date   ETH <10 01/12/2018   ETH <5 06/05/2016    Metabolic Disorder Labs: Lab Results  Component Value Date   HGBA1C 5.4 01/15/2018   MPG 108.28 01/15/2018   Lab Results  Component Value Date   PROLACTIN 24.4 (H) 06/08/2016   Lab Results  Component Value Date   CHOL 163 01/15/2018   TRIG 82 01/15/2018   HDL 39 (L) 01/15/2018   CHOLHDL 4.2 01/15/2018   VLDL 16 01/15/2018   LDLCALC 108 (H) 01/15/2018   LDLCALC 93 06/08/2016    Physical Findings: AIMS:  , ,  ,  ,    CIWA:  CIWA-Ar Total: 0 COWS:     Musculoskeletal: Strength & Muscle Tone: within normal limits Gait & Station: normal Patient leans: N/A  Psychiatric Specialty Exam: Physical Exam  Nursing note and vitals reviewed. Constitutional: She appears well-developed and well-nourished.  HENT:  Head: Normocephalic and atraumatic.  Eyes: Conjunctivae are normal. Pupils are equal, round, and reactive to light.  Neck: Normal range of motion.  Cardiovascular: Regular rhythm and normal heart sounds.  Respiratory: Effort normal. No respiratory distress.  GI: Soft. She exhibits no distension.  Musculoskeletal: Normal range of motion.  Neurological: She is alert.  Skin: Skin is warm and dry.  Psychiatric: Judgment normal. Her affect is blunt. Her speech is delayed. She is slowed. Thought content is not paranoid. Cognition and memory are normal. She expresses no homicidal and no suicidal ideation.    Review of Systems  Constitutional: Negative.   HENT: Negative.   Eyes: Negative.   Respiratory: Negative.   Cardiovascular: Negative.   Gastrointestinal: Negative.   Musculoskeletal: Negative.   Skin: Negative.    Neurological: Negative.   Psychiatric/Behavioral: Negative for depression, hallucinations, memory loss, substance abuse and suicidal ideas. The patient is not nervous/anxious and does not have insomnia.     Blood pressure 115/79, pulse 88, temperature 98.9 F (37.2 C), temperature source Oral, resp. rate 18, height 5\' 3"  (1.6 m), weight 71.2 kg (157 lb), SpO2 98 %.Body mass index is 27.81 kg/m.  General Appearance: Casual  Eye Contact:  Good  Speech:  Clear and Coherent  Volume:  Decreased  Mood:  Euthymic  Affect:  Constricted  Thought Process:  Goal Directed  Orientation:  Full (Time, Place, and Person)  Thought Content:  Logical  Suicidal Thoughts:  No  Homicidal Thoughts:  No  Memory:  Immediate;   Fair Recent;   Fair Remote;   Fair  Judgement:  Fair  Insight:  Fair  Psychomotor Activity:  Normal  Concentration:  Concentration: Fair  Recall:  FiservFair  Fund of Knowledge:  Fair  Language:  Fair  Akathisia:  No  Handed:  Right  AIMS (if indicated):     Assets:  Desire for Improvement Physical Health Resilience  ADL's:  Intact  Cognition:  WNL  Sleep:  Number of Hours: 6     Treatment Plan Summary: Daily contact with patient to assess and evaluate symptoms and progress in treatment, Medication management and Plan Patient is improving.  Affect calm and more appropriate.  No longer seeming frightened.  No longer appearing overtly psychotic.  Tolerating medicine well.  Supportive counseling no change to treatment.  Anticipated discharge after the weekend.  Mordecai RasmussenJohn Vanissa Strength, MD 01/21/2018, 12:21 PM

## 2018-01-22 NOTE — Progress Notes (Signed)
[facility]  01/22/2018 1:33 PM Para Lestine BoxFranshasta Duncanson  MRN:  [id number] Subjective: Follow-up patient with psychosis and bipolar disorder and substance abuse.  No new complaints today.  Denies any psychotic thinking.  Denies anxiety.  No new physical complaints.  Patient is pleasant lucid cooperative and appears in good health.  Vitals reviewed.  Labs reviewed no new labs to check. Principal Problem: Bipolar I disorder, single manic episode, severe, with psychosis (HCC) Diagnosis:   Patient Active Problem List   Diagnosis Date Noted  . Bipolar I disorder, single manic episode, severe, with psychosis (HCC) [F30.2] 01/14/2018  . Cannabis abuse [F12.10]   . Vitamin B12 deficiency [E53.8] 06/08/2016  . Cocaine use disorder, moderate, dependence (HCC) [F14.20] 06/07/2016  . Tobacco use disorder [F17.200] 06/07/2016  . Anemia [D64.9] 06/07/2016  . Bipolar I disorder, current or most recent episode manic, with psychotic features (HCC) [F31.2] 06/06/2016  . Cocaine abuse (HCC) [F14.10] 08/04/2015  . Dysmenorrhea [N94.6] 08/04/2015  . Hypertension [I10] 08/04/2015   Total Time spent with patient: 15 minutes  Past Psychiatric History: Past history of psychosis bipolar disorder substance abuse  Past Medical History:  Past Medical History:  Diagnosis Date  . Anemia   . Bipolar disorder (HCC)   . Hypertension     Past Surgical History:  Procedure Laterality Date  . CESAREAN SECTION     Family History: History reviewed. No pertinent family history. Family Psychiatric  History: None Social History:  Social History   Substance and Sexual Activity  Alcohol Use No     Social History   Substance and Sexual Activity  Drug Use Yes  . Types: Marijuana, Cocaine    Social History   Socioeconomic History  . Marital status: Married    Spouse name: None  . Number of children: None  . Years of education: None  . Highest education level: None  Social Needs  . Financial resource  strain: None  . Food insecurity - worry: None  . Food insecurity - inability: None  . Transportation needs - medical: None  . Transportation needs - non-medical: None  Occupational History  . None  Tobacco Use  . Smoking status: Current Some Day Smoker    Packs/day: 2.00    Types: Cigarettes  . Smokeless tobacco: Never Used  Substance and Sexual Activity  . Alcohol use: No  . Drug use: Yes    Types: Marijuana, Cocaine  . Sexual activity: Not Currently  Other Topics Concern  . None  Social History Narrative  . None   Additional Social History:                         Sleep: Good  Appetite:  Good  Current Medications: Current Facility-Administered Medications  Medication Dose Route Frequency Provider Last Rate Last Dose  . acetaminophen (TYLENOL) tablet 650 mg  650 mg Oral Q6H PRN Pattye Meda, Jackquline DenmarkJohn T, MD   650 mg at 01/22/18 0641  . alum & mag hydroxide-simeth (MAALOX/MYLANTA) 200-200-20 MG/5ML suspension 30 mL  30 mL Oral Q4H PRN Makalia Bare T, MD   30 mL at 01/15/18 1323  . benztropine (COGENTIN) tablet 0.5 mg  0.5 mg Oral BID Haskell RilingMcNew, Holly R, MD   0.5 mg at 01/22/18 0806  . ferrous sulfate tablet 325 mg  325 mg Oral BID WC Darliss RidgelKapur, Aarti K, MD   325 mg at 01/22/18 0806  . haloperidol (HALDOL) tablet 2 mg  2 mg Oral QHS McNew,  Ileene Hutchinson, MD   2 mg at 01/21/18 2135  . haloperidol (HALDOL) tablet 5 mg  5 mg Oral Bertram Gala, Ileene Hutchinson, MD   5 mg at 01/22/18 0640  . hydrOXYzine (ATARAX/VISTARIL) tablet 25 mg  25 mg Oral TID PRN Janalyn Higby, Jackquline Denmark, MD   25 mg at 01/19/18 1647  . loperamide (IMODIUM) capsule 2 mg  2 mg Oral PRN Darliss Ridgel, MD      . magnesium hydroxide (MILK OF MAGNESIA) suspension 30 mL  30 mL Oral Daily PRN Raziah Funnell T, MD      . ondansetron (ZOFRAN-ODT) disintegrating tablet 4 mg  4 mg Oral Q8H PRN Darliss Ridgel, MD      . traZODone (DESYREL) tablet 100 mg  100 mg Oral QHS PRN Ivanell Deshotel, Jackquline Denmark, MD   100 mg at 01/20/18 2233  . vitamin B-12  (CYANOCOBALAMIN) tablet 1,000 mcg  1,000 mcg Oral Daily McNew, Ileene Hutchinson, MD   1,000 mcg at 01/22/18 1610    Lab Results: No results found for this or any previous visit (from the past 48 hour(s)).  Blood Alcohol level:  Lab Results  Component Value Date   ETH <10 01/12/2018   ETH <5 06/05/2016    Metabolic Disorder Labs: Lab Results  Component Value Date   HGBA1C 5.4 01/15/2018   MPG 108.28 01/15/2018   Lab Results  Component Value Date   PROLACTIN 24.4 (H) 06/08/2016   Lab Results  Component Value Date   CHOL 163 01/15/2018   TRIG 82 01/15/2018   HDL 39 (L) 01/15/2018   CHOLHDL 4.2 01/15/2018   VLDL 16 01/15/2018   LDLCALC 108 (H) 01/15/2018   LDLCALC 93 06/08/2016    Physical Findings: AIMS:  , ,  ,  ,    CIWA:  CIWA-Ar Total: 0 COWS:     Musculoskeletal: Strength & Muscle Tone: within normal limits Gait & Station: normal Patient leans: N/A  Psychiatric Specialty Exam: Physical Exam  Nursing note and vitals reviewed. Constitutional: She appears well-developed and well-nourished.  HENT:  Head: Normocephalic and atraumatic.  Eyes: Conjunctivae are normal. Pupils are equal, round, and reactive to light.  Neck: Normal range of motion.  Cardiovascular: Regular rhythm and normal heart sounds.  Respiratory: Effort normal. No respiratory distress.  GI: Soft. She exhibits no distension.  Musculoskeletal: Normal range of motion.  Neurological: She is alert.  Skin: Skin is warm and dry.  Psychiatric: She has a normal mood and affect. Her behavior is normal. Judgment and thought content normal.    Review of Systems  Constitutional: Negative.   HENT: Negative.   Eyes: Negative.   Respiratory: Negative.   Cardiovascular: Negative.   Gastrointestinal: Negative.   Musculoskeletal: Negative.   Skin: Negative.   Neurological: Negative.   Psychiatric/Behavioral: Negative.     Blood pressure 123/87, pulse 80, temperature 98.4 F (36.9 C), temperature source Oral,  resp. rate 16, height 5\' 3"  (1.6 m), weight 71.2 kg (157 lb), SpO2 98 %.Body mass index is 27.81 kg/m.  General Appearance: Casual  Eye Contact:  Fair  Speech:  Slow  Volume:  Decreased  Mood:  Euthymic  Affect:  Constricted  Thought Process:  Goal Directed  Orientation:  Full (Time, Place, and Person)  Thought Content:  Logical  Suicidal Thoughts:  No  Homicidal Thoughts:  No  Memory:  Immediate;   Fair Recent;   Fair Remote;   Fair  Judgement:  Fair  Insight:  Fair  Psychomotor Activity:  Decreased  Concentration:  Concentration: Fair  Recall:  [employer] of Knowledge:  Fair  Language:  Fair  Akathisia:  No  Handed:  Right  AIMS (if indicated):     Assets:  Desire for Improvement Housing Physical Health  ADL's:  Intact  Cognition:  WNL  Sleep:  Number of Hours: 6     Treatment Plan Summary: Daily contact with patient to assess and evaluate symptoms and progress in treatment, Medication management and Plan Patient seen chart reviewed.  No new complaints.  Treatment progressing appropriately.  No need to make any change to medicine.  Supportive counseling completed.  Patient looking forward to possible discharge soon.  Mordecai Rasmussen, MD 01/22/2018, 1:33 PM

## 2018-01-22 NOTE — Plan of Care (Signed)
D: Pt denies SI/HI/AV hallucinations. Pt is pleasant and cooperative.  A: Pt was offered support and encouragement. Pt was given scheduled medications. Pt was encourage to attend groups. Q 15 minute checks were done for safety.  R:Pt attends groups and interacts well with peers and staff. Pt is taking medication. Pt has no complaints.Pt receptive to treatment and safety maintained on unit.   Progressing Activity: Sleeping patterns will improve 01/22/2018 1605 - Progressing by Curly RimBaker, Sarit Sparano B, RN Education: Knowledge of [facility] [facility] Education information/materials will improve 01/22/2018 1605 - Progressing by Curly RimBaker, Dickey Caamano B, RN Coping: Ability to verbalize frustrations and anger appropriately will improve 01/22/2018 1605 - Progressing by Curly RimBaker, Camille Thau B, RN Ability to demonstrate self-control will improve 01/22/2018 1605 - Progressing by Curly RimBaker, Jammie Clink B, RN Education: Ability to state activities that reduce stress will improve 01/22/2018 1605 - Progressing by Curly RimBaker, Lucero Auzenne B, RN Self-Concept: Ability to verbalize positive feelings about self will improve 01/22/2018 1605 - Progressing by Curly RimBaker, Calvina Liptak B, RN Pain Managment: General experience of comfort will improve 01/22/2018 1605 - Progressing by Curly RimBaker, Kesean Serviss B, RN

## 2018-01-22 NOTE — [facility] Group Notes (Signed)
LCSW Group Therapy Note 01/22/2018 1:15pm  Type of Therapy and Topic: Group Therapy: Feelings Around Returning Home & Establishing a Supportive Framework and Supporting Oneself When Supports Not Available  Participation Level: Active  Description of Group:  Patients first processed thoughts and feelings about upcoming discharge. These included fears of upcoming changes, lack of change, new living environments, judgements and expectations from others and overall stigma of mental health issues. The group then discussed the definition of a supportive framework, what that looks and feels like, and how do to discern it from an unhealthy non-supportive network. The group identified different types of supports as well as what to do when your family/friends are less than helpful or unavailable  Therapeutic Goals  1. Patient will identify one healthy supportive network that they can use at discharge. 2. Patient will identify one factor of a supportive framework and how to tell it from an unhealthy network. 3. Patient able to identify one coping skill to use when they do not have positive supports from others. 4. Patient will demonstrate ability to communicate their needs through discussion and/or role plays.  Summary of Patient Progress:  Pt scored her mood 7 (10 best). Pt engaged  during group session. As patients processed their anxiety about discharge and described healthy supports patient shared that she is ready to go back with her aunt. She stated that she feels more at peace with herself.  Patients identified at least one self-care tool they were willing to use after discharge.   Therapeutic Modalities Cognitive Behavioral Therapy Motivational Interviewing   Beth Goodlin  CUEBAS-COLON, LCSW 01/22/2018 12:28 PM

## 2018-01-22 NOTE — Progress Notes (Signed)
Pt been pleasant and cooperative on shift and compliant with medications. Conversation was logical , she was visible in the milieu interacting well with staff and peers, and no behavioral issues to report on shift at this time.Medications given as prescribed. Safety checks maintained. Pt remains resting in bed eyes closed at this time.

## 2018-01-23 NOTE — Progress Notes (Signed)
Patient denies SI/HI, denies A/V hallucinations. Patient verbalizes understanding of discharge instructions, follow up care and prescriptions.7 days medicines given to patient. Patient given all belongings from  locker. Patient escorted out by staff, transported by family. 

## 2018-01-23 NOTE — Progress Notes (Signed)
Patient was visible in the milieu. Alert and oriented x 4. Denying thoughts of self harm. Denying hallucinations. Participated in evening activities and interacted with staff and peers appropriately. Received bedtime medications and expressed readiness for discharge. Was educated about discharge plan and was encouraged to discuss it with MD. Currently in bed sleeping. Safety precautions maintained.

## 2018-01-23 NOTE — Discharge Summary (Signed)
Physician Discharge Summary Note  Patient:  Annette Garrison is an 42 y.o., female MRN:  [id number] DOB:  07-03-1976 Patient phone:  [phone] (home)  Patient address:   [place] [address] [place] [address],  Total Time spent with patient: 15 minutes  Plus 20 minutes of medication reconciliation, discharge planning, and discharge documentation   Date of Admission:  01/14/2018 Date of Discharge: 01/23/18  Reason for Admission:  Delusions, paranoia  Principal Problem: Bipolar I disorder, single manic episode, severe, with psychosis [facility]) Discharge Diagnoses: Patient Active Problem List   Diagnosis Date Noted  . Bipolar I disorder, single manic episode, severe, with psychosis (HCC) [F30.2] 01/14/2018    Priority: High  . Cannabis abuse [F12.10]   . Vitamin B12 deficiency [E53.8] 06/08/2016  . Cocaine use disorder, moderate, dependence (HCC) [F14.20] 06/07/2016  . Tobacco use disorder [F17.200] 06/07/2016  . Anemia [D64.9] 06/07/2016  . Bipolar I disorder, current or most recent episode manic, with psychotic features (HCC) [F31.2] 06/06/2016  . Cocaine abuse (HCC) [F14.10] 08/04/2015  . Dysmenorrhea [N94.6] 08/04/2015  . Hypertension [I10] 08/04/2015    Past Psychiatric History: See H&P  Past Medical History:  Past Medical History:  Diagnosis Date  . Anemia   . Bipolar disorder (HCC)   . Hypertension     Past Surgical History:  Procedure Laterality Date  . CESAREAN SECTION     Family History: History reviewed. No pertinent family history. Family Psychiatric  History: See H&P Social History:  Social History   Substance and Sexual Activity  Alcohol Use No     Social History   Substance and Sexual Activity  Drug Use Yes  . Types: Marijuana, Cocaine    Social History   Socioeconomic History  . Marital status: Married    Spouse name: None  . Number of children: None  . Years of education: None  . Highest education level: None  Social Needs  . Financial  resource strain: None  . Food insecurity - worry: None  . Food insecurity - inability: None  . Transportation needs - medical: None  . Transportation needs - non-medical: None  Occupational History  . None  Tobacco Use  . Smoking status: Current Some Day Smoker    Packs/day: 2.00    Types: Cigarettes  . Smokeless tobacco: Never Used  Substance and Sexual Activity  . Alcohol use: No  . Drug use: Yes    Types: Marijuana, Cocaine  . Sexual activity: Not Currently  Other Topics Concern  . None  Social History Narrative  . None    Hospital Course:  Pt was initially started on Seroquel as this was a past medication. She did not want to increase above 150 mg due to sedation however was still having AH and responding to internal stimuli. She agreed to a trial of Haldol which she tolerated very well. She felt it was very effective for AH. Seroquel was tapered off as she was sedated during the day. She was very appropriate with peers on he unit. No outbursts or bizarre behaviors. She agreed to outpatient substance treatment at [facility]. On day of discharge, she stated that she was feeling better. AH were completely resolved. When asked, she denied SI or an y thoughts of self harm. Denied HI. She states that she his sleeping well and eating well. She did speak with her aunt and are on better terms. She is planning to meet with Lorella Nimrod on Wednesday for follow up. She denied any side effects  of haldol including muscle stiffness and not cogwheeling or stiffness noted on exam. She did not appear manic or psychotic.   The patient is at low risk of imminent suicide. Patient denied thoughts, intent, or plan for harm to self or others, expressed significant future orientation, and expressed an ability to mobilize assistance for her needs. She is presently void of any contributing psychiatric symptoms, cognitive difficulties, or substance use which would elevate her risk for lethality. Chronic risk for lethality is  elevated in light of substance abuse. The chronic risk is presently mitigated by her ongoing [facility] and [facility] and mobilization of support from family and friends. Chronic risk may elevate if she experiences any significant loss or worsening of symptoms, which can be managed and monitored through outpatient providers. At this time,a cute risk for lethality is low and she is stable for ongoing outpatient management.   Modifiable risk factors were addressed during this hospitalization through appropriate pharmacotherapy and establishment of outpatient follow-up treatment. Some risk factors for suicide are situational (i.e. Unstable housing) or related personality pathology (i.e. Poor coping mechanisms) and thus cannot be further mitigated by continued hospitalization in this setting.    Physical Findings: AIMS:  , ,  ,  ,    CIWA:  CIWA-Ar Total: 0 COWS:     Musculoskeletal: Strength & Muscle Tone: within normal limits Gait & Station: normal Patient leans: N/A  Psychiatric Specialty Exam: Physical Exam  Nursing note and vitals reviewed.   Review of Systems  All other systems reviewed and are negative.   Blood pressure (!) 116/29, pulse 90, temperature 98.1 F (36.7 C), temperature source Oral, resp. rate 16, height 5\' 3"  (1.6 m), weight 71.2 kg (157 lb), SpO2 98 %.Body mass index is 27.81 kg/m.  General Appearance: Casual  Eye Contact:  Fair  Speech:  Clear and Coherent  Volume:  Normal  Mood:  Euthymic  Affect:  Congruent  Thought Process:  Coherent and Goal Directed  Orientation:  Full (Time, Place, and Person)  Thought Content:  Logical  Suicidal Thoughts:  No  Homicidal Thoughts:  No  Memory:  Immediate;   Fair  Judgement:  Fair  Insight:  Fair  Psychomotor Activity:  Normal  Concentration:  Concentration: Fair  Recall:  Fair  Fund of Knowledge:  Fair  Language:  Fair  Akathisia:  No      Assets:  Communication Skills Resilience  ADL's:  Intact   Cognition:  WNL  Sleep:  Number of Hours: 6.5     Have you used any form of tobacco in the last 30 days? (Cigarettes, Smokeless Tobacco, Cigars, and/or Pipes): Yes  Has this patient used any form of tobacco in the last 30 days? (Cigarettes, Smokeless Tobacco, Cigars, and/or Pipes) Yes, Refuses treatment  Blood Alcohol level:  Lab Results  Component Value Date   ETH <10 01/12/2018   ETH <5 06/05/2016    Metabolic Disorder Labs:  Lab Results  Component Value Date   HGBA1C 5.4 01/15/2018   MPG 108.28 01/15/2018   Lab Results  Component Value Date   PROLACTIN 24.4 (H) 06/08/2016   Lab Results  Component Value Date   CHOL 163 01/15/2018   TRIG 82 01/15/2018   HDL 39 (L) 01/15/2018   CHOLHDL 4.2 01/15/2018   VLDL 16 01/15/2018   LDLCALC 108 (H) 01/15/2018   LDLCALC 93 06/08/2016    See Psychiatric Specialty Exam and Suicide Risk Assessment completed by Attending Physician prior to discharge.  Discharge destination:  Home  Is patient on multiple antipsychotic therapies at discharge:  No   Has Patient had three or more failed trials of antipsychotic monotherapy by history:  No  Recommended Plan for Multiple Antipsychotic Therapies: NA  Discharge Instructions    Increase activity slowly   Complete by:  As directed      Allergies as of 01/23/2018   No Known Allergies     Medication List    STOP taking these medications   metroNIDAZOLE 500 MG tablet Commonly known as:  FLAGYL   QUEtiapine 100 MG tablet Commonly known as:  SEROQUEL     TAKE these medications     Indication  benztropine 0.5 MG tablet Commonly known as:  COGENTIN Take 1 tablet (0.5 mg total) by mouth 2 (two) times daily.  Indication:  Extrapyramidal Reaction caused by Medications   cyanocobalamin 1000 MCG tablet Take 1 tablet (1,000 mcg total) by mouth daily.  Indication:  Inadequate Vitamin B12   ferrous sulfate 325 (65 FE) MG tablet Take 1 tablet (325 mg total) by mouth 2 (two) times  daily with a meal.  Indication:  Iron Deficiency   haloperidol 2 MG tablet Commonly known as:  HALDOL Take 1 tablet (2 mg total) by mouth at bedtime.  Indication:  Psychosis   haloperidol 5 MG tablet Commonly known as:  HALDOL Take 1 tablet (5 mg total) by mouth every morning.  Indication:  Psychosis   traZODone 100 MG tablet Commonly known as:  DESYREL Take 1 tablet (100 mg total) by mouth at bedtime as needed for sleep.  Indication:  Trouble Sleeping      Follow-up Information    [facility] Follow up on 01/25/2018.   Why:  Unk Pinto will pick you up at 7:00 AM for Peer Support Services.  Lorella Nimrod can be reached at [phone].   Your Supported Airline pilot, Okey Regal, will meet with you at your home on 01/27/18 at 2:15 PM.  Contact information: [address] [place] [address] [phone]           Follow-up recommendations:  Follow up with [facility]  Signed: Haskell Riling, MD 01/23/2018, 9:45 AM

## 2018-01-23 NOTE — Plan of Care (Signed)
Pleasant and cooperative. Compliant with treatment 

## 2018-01-23 NOTE — [facility] Suicide Risk Assessment (Signed)
[facility]   Principal Problem: Bipolar I disorder, single manic episode, severe, with psychosis [facility](HCC) Discharge Diagnoses:  Patient Active Problem List   Diagnosis Date Noted  . Bipolar I disorder, single manic episode, severe, with psychosis (HCC) [F30.2] 01/14/2018    Priority: High  . Cannabis abuse [F12.10]   . Vitamin B12 deficiency [E53.8] 06/08/2016  . Cocaine use disorder, moderate, dependence (HCC) [F14.20] 06/07/2016  . Tobacco use disorder [F17.200] 06/07/2016  . Anemia [D64.9] 06/07/2016  . Bipolar I disorder, current or most recent episode manic, with psychotic features (HCC) [F31.2] 06/06/2016  . Cocaine abuse (HCC) [F14.10] 08/04/2015  . Dysmenorrhea [N94.6] 08/04/2015  . Hypertension [I10] 08/04/2015     Mental Status Per Nursing Assessment::   On Admission:     Demographic Factors:  NA  Loss Factors: Decrease in vocational status  Historical Factors: Impulsivity  Risk Reduction Factors:   Living with another person, especially a relative, Positive social support and Positive therapeutic relationship  Continued Clinical Symptoms:  Substance use  Cognitive Features That Contribute To Risk:  None    Suicide Risk:  Minimal: No identifiable suicidal ideation.  ; may be classified as minimal risk based on the severity of the depressive symptoms  Follow-up Information    [facility] Follow up on 01/25/2018.   Why:  Annette Garrison will pick you up at 7:00 AM for Peer Support Services.  Annette Garrison can be reached at [phone].   Your Supported Airline pilotmployment Specialist, Annette Garrison, will meet with you at your home on 01/27/18 at 2:15 PM.  Contact information: [address] [place] [address] [phone]           Plan Of Care/Follow-up recommendations: Follow up with [facility]  Haskell RilingHolly R Huston Stonehocker, MD 01/23/2018, 9:43 AM

## 2018-05-14 ENCOUNTER — Encounter: Payer: Self-pay | Admitting: Emergency Medicine

## 2018-05-14 ENCOUNTER — Emergency Department
Admission: EM | Admit: 2018-05-14 | Discharge: 2018-05-14 | Disposition: A | Discharge status: Home / Self Care | Payer: Self-pay | Attending: Emergency Medicine | Admitting: Emergency Medicine

## 2018-05-14 ENCOUNTER — Other Ambulatory Visit: Payer: Self-pay

## 2018-05-14 DIAGNOSIS — I10 Essential (primary) hypertension: Secondary | ICD-10-CM | POA: Insufficient documentation

## 2018-05-14 DIAGNOSIS — E860 Dehydration: Secondary | ICD-10-CM

## 2018-05-14 DIAGNOSIS — Z79899 Other long term (current) drug therapy: Secondary | ICD-10-CM | POA: Insufficient documentation

## 2018-05-14 DIAGNOSIS — N730 Acute parametritis and pelvic cellulitis: Secondary | ICD-10-CM | POA: Insufficient documentation

## 2018-05-14 DIAGNOSIS — N760 Acute vaginitis: Secondary | ICD-10-CM | POA: Insufficient documentation

## 2018-05-14 DIAGNOSIS — B9689 Other specified bacterial agents as the cause of diseases classified elsewhere: Secondary | ICD-10-CM

## 2018-05-14 DIAGNOSIS — Z3202 Encounter for pregnancy test, result negative: Secondary | ICD-10-CM | POA: Insufficient documentation

## 2018-05-14 DIAGNOSIS — F17210 Nicotine dependence, cigarettes, uncomplicated: Secondary | ICD-10-CM | POA: Insufficient documentation

## 2018-05-14 LAB — WET PREP, GENITAL
SPERM: NONE SEEN
Trich, Wet Prep: NONE SEEN
Yeast Wet Prep HPF POC: NONE SEEN

## 2018-05-14 LAB — CHLAMYDIA/NGC RT PCR (ARMC ONLY)
Chlamydia Tr: NOT DETECTED
N gonorrhoeae: NOT DETECTED

## 2018-05-14 LAB — CBC
HEMATOCRIT: 34.8 % — AB (ref 35.0–47.0)
Hemoglobin: 11 g/dL — ABNORMAL LOW (ref 12.0–16.0)
MCH: 22 pg — ABNORMAL LOW (ref 26.0–34.0)
MCHC: 31.6 g/dL — AB (ref 32.0–36.0)
MCV: 69.7 fL — ABNORMAL LOW (ref 80.0–100.0)
PLATELETS: 351 10*3/uL (ref 150–440)
RBC: 4.99 MIL/uL (ref 3.80–5.20)
RDW: 18 % — AB (ref 11.5–14.5)
WBC: 6.7 10*3/uL (ref 3.6–11.0)

## 2018-05-14 LAB — URINALYSIS, COMPLETE (UACMP) WITH MICROSCOPIC
Bacteria, UA: NONE SEEN
Bilirubin Urine: NEGATIVE
Glucose, UA: NEGATIVE mg/dL
HGB URINE DIPSTICK: NEGATIVE
KETONES UR: NEGATIVE mg/dL
Leukocytes, UA: NEGATIVE
Nitrite: NEGATIVE
PH: 6 (ref 5.0–8.0)
PROTEIN: NEGATIVE mg/dL
Specific Gravity, Urine: 1.023 (ref 1.005–1.030)

## 2018-05-14 LAB — COMPREHENSIVE METABOLIC PANEL
ALBUMIN: 3.8 g/dL (ref 3.5–5.0)
ALK PHOS: 61 U/L (ref 38–126)
ALT: 12 U/L (ref 0–44)
AST: 24 U/L (ref 15–41)
Anion gap: 6 (ref 5–15)
BUN: 7 mg/dL (ref 6–20)
CALCIUM: 8.7 mg/dL — AB (ref 8.9–10.3)
CO2: 22 mmol/L (ref 22–32)
CREATININE: 0.92 mg/dL (ref 0.44–1.00)
Chloride: 108 mmol/L (ref 98–111)
GFR calc Af Amer: >60 mL/min (ref >60)
GLUCOSE: 97 mg/dL (ref 70–99)
POTASSIUM: 4 mmol/L (ref 3.5–5.1)
Sodium: 136 mmol/L (ref 135–145)
TOTAL PROTEIN: 7.7 g/dL (ref 6.5–8.1)
Total Bilirubin: 0.4 mg/dL (ref 0.3–1.2)

## 2018-05-14 LAB — POCT PREGNANCY, URINE: PREG TEST UR: NEGATIVE

## 2018-05-14 LAB — LIPASE, BLOOD: Lipase: 31 U/L (ref 11–51)

## 2018-05-14 MED ORDER — ONDANSETRON 4 MG PO TBDP
8.0000 mg | ORAL_TABLET | Freq: Once | ORAL | Status: AC
Start: 2018-05-14 — End: 2018-05-14
  Administered 2018-05-14: 8 mg via ORAL
  Filled 2018-05-14: qty 2

## 2018-05-14 MED ORDER — ONDANSETRON 4 MG PO TBDP: 4.0000 mg | ORAL_TABLET | Freq: Three times a day (TID) | ORAL | 0 refills | Status: DC | PRN

## 2018-05-14 MED ORDER — METOCLOPRAMIDE HCL 10 MG PO TABS
ORAL_TABLET | ORAL | Status: AC
  Filled 2018-05-14: qty 1

## 2018-05-14 MED ORDER — FAMOTIDINE 20 MG PO TABS
ORAL_TABLET | ORAL | Status: AC
  Filled 2018-05-14: qty 2

## 2018-05-14 MED ORDER — DOXYCYCLINE HYCLATE 100 MG PO CAPS: 100.0000 mg | ORAL_CAPSULE | Freq: Two times a day (BID) | ORAL | 0 refills | Status: DC

## 2018-05-14 MED ORDER — FAMOTIDINE 20 MG PO TABS
40.0000 mg | ORAL_TABLET | Freq: Once | ORAL | Status: AC
  Administered 2018-05-14: 40 mg via ORAL

## 2018-05-14 MED ORDER — DOXYCYCLINE HYCLATE 100 MG PO TABS
100.0000 mg | ORAL_TABLET | Freq: Once | ORAL | Status: AC
  Administered 2018-05-14: 100 mg via ORAL

## 2018-05-14 MED ORDER — CEFTRIAXONE SODIUM 1 G IJ SOLR
INTRAMUSCULAR | Status: AC
  Filled 2018-05-14: qty 10

## 2018-05-14 MED ORDER — SODIUM CHLORIDE 0.9 % IV BOLUS
1000.0000 mL | Freq: Once | INTRAVENOUS | Status: AC
  Administered 2018-05-14: 1000 mL via INTRAVENOUS

## 2018-05-14 MED ORDER — DOXYCYCLINE HYCLATE 100 MG PO TABS
ORAL_TABLET | ORAL | Status: AC
  Filled 2018-05-14: qty 1

## 2018-05-14 MED ORDER — METOCLOPRAMIDE HCL 10 MG PO TABS
10.0000 mg | ORAL_TABLET | Freq: Once | ORAL | Status: AC
  Administered 2018-05-14: 10 mg via ORAL

## 2018-05-14 MED ORDER — SODIUM CHLORIDE 0.9 % IV SOLN
1.0000 g | Freq: Once | INTRAVENOUS | Status: AC
  Administered 2018-05-14: 1 g via INTRAVENOUS

## 2018-05-14 MED ORDER — ONDANSETRON 4 MG PO TBDP
4.0000 mg | ORAL_TABLET | Freq: Once | ORAL | Status: AC | PRN
  Administered 2018-05-14: 4 mg via ORAL
  Filled 2018-05-14: qty 1

## 2018-05-14 MED ORDER — METRONIDAZOLE 500 MG PO TABS: 500.0000 mg | ORAL_TABLET | Freq: Two times a day (BID) | ORAL | 0 refills | Status: DC

## 2018-05-14 MED ORDER — GI COCKTAIL ~~LOC~~
30.0000 mL | ORAL | Status: AC
  Administered 2018-05-14: 30 mL via ORAL
  Filled 2018-05-14: qty 30

## 2018-05-14 NOTE — ED Triage Notes (Signed)
Pt in via POV with complaints of nausea/vomiting x one week, reports some pelvic pain.  Vitals WDL.  NAD noted at this time.

## 2018-05-14 NOTE — ED Provider Notes (Signed)
[facility] Emergency Department Provider Note  ____________________________________________  Time seen: Approximately 3:52 PM  I have reviewed the triage vital signs and the nursing notes.   HISTORY  Chief Complaint Emesis    HPI Sascha Estill Batten Aldous is a 42 y.o. female who complains of diffuse lower abdominal pain described as cramping, moderate intensity, radiating into the lower back, no aggravating or alleviating factors.  Associated with recent heavy menstrual cycle, no abnormal vaginal discharge.  No dysuria frequency urgency or hematuria.  No trauma.  No fevers chills or sweats.  With this she is also having associated symptoms of nausea and vomiting and decreased p.o. tolerance.  She has been sexually active without using barrier protection.      Past Medical History:  Diagnosis Date  . Anemia   . Bipolar disorder (HCC)   . Hypertension      Patient Active Problem List   Diagnosis Date Noted  . Bipolar I disorder, single manic episode, severe, with psychosis (HCC) 01/14/2018  . Cannabis abuse   . Vitamin B12 deficiency 06/08/2016  . Cocaine use disorder, moderate, dependence (HCC) 06/07/2016  . Tobacco use disorder 06/07/2016  . Anemia 06/07/2016  . Bipolar I disorder, current or most recent episode manic, with psychotic features (HCC) 06/06/2016  . Cocaine abuse (HCC) 08/04/2015  . Dysmenorrhea 08/04/2015  . Hypertension 08/04/2015     Past Surgical History:  Procedure Laterality Date  . CESAREAN SECTION       Prior to Admission medications   Medication Sig Start Date End Date Taking? Authorizing Provider  benztropine (COGENTIN) 0.5 MG tablet Take 1 tablet (0.5 mg total) by mouth 2 (two) times daily. 01/20/18   McNew, Ileene Hutchinson, MD  ferrous sulfate 325 (65 FE) MG tablet Take 1 tablet (325 mg total) by mouth 2 (two) times daily with a meal. 01/20/18   McNew, Ileene Hutchinson, MD  haloperidol (HALDOL) 2 MG tablet Take 1 tablet (2 mg total) by  mouth at bedtime. 01/20/18   McNew, Ileene Hutchinson, MD  haloperidol (HALDOL) 5 MG tablet Take 1 tablet (5 mg total) by mouth every morning. 01/21/18   McNew, Ileene Hutchinson, MD  traZODone (DESYREL) 100 MG tablet Take 1 tablet (100 mg total) by mouth at bedtime as needed for sleep. 01/20/18   McNew, Ileene Hutchinson, MD  vitamin B-12 1000 MCG tablet Take 1 tablet (1,000 mcg total) by mouth daily. Patient not taking: Reported on 11/17/2016 06/08/16   Shari Prows, MD     Allergies Patient has no known allergies.   No family history on file.  Social History Social History   Tobacco Use  . Smoking status: Current Some Day Smoker    Packs/day: 0.50    Types: Cigarettes  . Smokeless tobacco: Never Used  Substance Use Topics  . Alcohol use: No  . Drug use: Yes    Types: Marijuana, Cocaine    Review of Systems  Constitutional:   No fever or chills.  ENT:   No sore throat. No rhinorrhea. Cardiovascular:   No chest pain or syncope. Respiratory:   No dyspnea or cough. Gastrointestinal:   Positive as above for abdominal pain and vomiting.  No diarrhea or constipation. Musculoskeletal:   Negative for focal pain or swelling All other systems reviewed and are negative except as documented above in ROS and HPI.  ____________________________________________   PHYSICAL EXAM:  VITAL SIGNS: ED Triage Vitals  Enc Vitals Group     BP 05/14/18 1307 (!) 141/98  Pulse Rate 05/14/18 1307 83     Resp 05/14/18 1307 16     Temp 05/14/18 1307 99.3 F (37.4 C)     Temp Source 05/14/18 1307 Oral     SpO2 05/14/18 1307 98 %     Weight 05/14/18 1308 130 lb (59 kg)     Height 05/14/18 1308 5\' 3"  (1.6 m)     Head Circumference --      Peak Flow --      Pain Score 05/14/18 1307 5     Pain Loc --      Pain Edu? --      Excl. in GC? --     Vital signs reviewed, nursing assessments reviewed.   Constitutional:   Alert and oriented. Non-toxic appearance. Eyes:   Conjunctivae are normal. EOMI. PERRL. ENT       Head:   Normocephalic and atraumatic.      Nose:   No congestion/rhinnorhea.  Significantly diminished nasal septum, possibly from remote necrosis      Mouth/Throat:   MMM, no pharyngeal erythema. No peritonsillar mass.       Neck:   No meningismus. Full ROM. Hematological/Lymphatic/Immunilogical:   No cervical lymphadenopathy. Cardiovascular:   RRR. Symmetric bilateral radial and DP pulses.  No murmurs.  Respiratory:   Normal respiratory effort without tachypnea/retractions. Breath sounds are clear and equal bilaterally. No wheezes/rales/rhonchi. Gastrointestinal:   Soft with diffuse lower abdominal tenderness, nonfocal.. Non distended. There is no CVA tenderness.  No rebound, rigidity, or guarding. Genitourinary:   Pelvic exam performed with nurse him at bedside.  External exam unremarkable.  Speculum exam shows white discharge in the vault.  Bimanual exam reveals cervical motion tenderness without adnexal tenderness or masses.  Uterus is not enlarged or tender on bimanual palpation. Musculoskeletal:   Normal range of motion in all extremities. No joint effusions.  No lower extremity tenderness.  No edema. Neurologic:   Normal speech and language.  Motor grossly intact. No acute focal neurologic deficits are appreciated.  Skin:    Skin is warm, dry and intact. No rash noted.  No petechiae, purpura, or bullae.  ____________________________________________    LABS (pertinent positives/negatives) (all labs ordered are listed, but only abnormal results are displayed) Labs Reviewed  WET PREP, GENITAL - Abnormal; Notable for the following components:      Result Value   Clue Cells Wet Prep HPF POC PRESENT (*)    WBC, Wet Prep HPF POC FEW (*)    All other components within normal limits  COMPREHENSIVE METABOLIC PANEL - Abnormal; Notable for the following components:   Calcium 8.7 (*)    All other components within normal limits  CBC - Abnormal; Notable for the following components:    Hemoglobin 11.0 (*)    HCT 34.8 (*)    MCV 69.7 (*)    MCH 22.0 (*)    MCHC 31.6 (*)    RDW 18.0 (*)    All other components within normal limits  URINALYSIS, COMPLETE (UACMP) WITH MICROSCOPIC - Abnormal; Notable for the following components:   Color, Urine YELLOW (*)    APPearance CLOUDY (*)    All other components within normal limits  CHLAMYDIA/NGC RT PCR ([facility] ONLY)  LIPASE, BLOOD  POC URINE PREG, ED  POCT PREGNANCY, URINE   ____________________________________________   EKG    ____________________________________________    RADIOLOGY  No results found.  ____________________________________________   PROCEDURES Procedures  ____________________________________________  DIFFERENTIAL DIAGNOSIS   Bacterial vaginosis, trichomoniasis, PID  from gonorrhea or chlamydia.  Low suspicion diverticulitis, bowel perforation or obstruction, appendicitis, torsion.  CLINICAL IMPRESSION / ASSESSMENT AND PLAN / ED COURSE  Pertinent labs & imaging results that were available during my care of the patient were reviewed by me and considered in my medical decision making (see chart for details).    Patient is nontoxic.  Not pregnant.  Labs are unremarkable, vital signs are unremarkable.  Exam suggestive of STI.  Wet prep and GC chlamydia PCR sent to lab.  Due to dizziness and vomiting in the setting of poor oral intake over the past week, I suspect patient is also dehydrated and will give IV fluid bolus for rehydration, Zofran and GI cocktail for symptom relief.  Anticipate patient will be suitable for discharge home with antibiotic treatment for PID.  Clinical Course as of May 14 1617  Sun May 14, 2018  1542 Positive for clue cells. Ceftriaxone given for possible gonorrhea. Will DC with flagyl and doxycycline for tx of bacteral vaginosis and presumptive tx of chlamydia PID.   Clue Cells Wet Prep HPF POC(!): PRESENT [PS]  1615 Tolerating oral intake with Mountain Dew and  crackers    [PS]    Clinical Course User Index [PS] Sharman Cheek, MD     ____________________________________________   FINAL CLINICAL IMPRESSION(S) / ED DIAGNOSES    Final diagnoses:  Dehydration  Bacterial vaginosis  PID (acute pelvic inflammatory disease)     ED Discharge Orders    None      Portions of this note were generated with dragon dictation software. Dictation errors may occur despite best attempts at proofreading.    Sharman Cheek, MD 05/14/18 [phone]

## 2018-05-14 NOTE — ED Notes (Signed)
First Nurse Note: Pt to ED c/o vomiting and dizziness. Pt in NAD at this time.

## 2018-05-14 NOTE — Discharge Instructions (Signed)
Take metronidazole and doxycycline as prescribed to treat your infection.  Do not drink alcohol while taking these antibiotics because the combination can cause severe nausea and vomiting.

## 2018-05-24 ENCOUNTER — Encounter: Payer: Self-pay | Admitting: Emergency Medicine

## 2018-05-24 ENCOUNTER — Emergency Department
Admission: EM | Admit: 2018-05-24 | Discharge: 2018-05-24 | Disposition: A | Discharge status: Home / Self Care | Payer: Self-pay | Attending: Emergency Medicine | Admitting: Emergency Medicine

## 2018-05-24 DIAGNOSIS — R519 Headache, unspecified: Secondary | ICD-10-CM

## 2018-05-24 DIAGNOSIS — Z79899 Other long term (current) drug therapy: Secondary | ICD-10-CM | POA: Insufficient documentation

## 2018-05-24 DIAGNOSIS — F17210 Nicotine dependence, cigarettes, uncomplicated: Secondary | ICD-10-CM | POA: Insufficient documentation

## 2018-05-24 DIAGNOSIS — R51 Headache: Secondary | ICD-10-CM | POA: Insufficient documentation

## 2018-05-24 DIAGNOSIS — I10 Essential (primary) hypertension: Secondary | ICD-10-CM | POA: Insufficient documentation

## 2018-05-24 MED ORDER — ONDANSETRON 4 MG PO TBDP
4.0000 mg | ORAL_TABLET | Freq: Once | ORAL | Status: AC
  Administered 2018-05-24: 4 mg via ORAL

## 2018-05-24 MED ORDER — BUTALBITAL-APAP-CAFFEINE 50-325-40 MG PO TABS
1.0000 | ORAL_TABLET | Freq: Once | ORAL | Status: AC
  Administered 2018-05-24: 1 via ORAL
  Filled 2018-05-24: qty 1

## 2018-05-24 MED ORDER — ONDANSETRON 4 MG PO TBDP
ORAL_TABLET | ORAL | Status: AC
  Filled 2018-05-24: qty 1

## 2018-05-24 NOTE — ED Notes (Signed)
Pt given coke and saltine crackers 

## 2018-05-24 NOTE — Discharge Instructions (Addendum)
Please seek medical attention for any high fevers, chest pain, shortness of breath, change in behavior, persistent vomiting, bloody stool or any other new or concerning symptoms.  

## 2018-05-24 NOTE — ED Provider Notes (Signed)
[facility] Emergency Department Provider Note  ____________________________________________   I have reviewed the triage vital signs and the nursing notes.   HISTORY  Chief Complaint Headache   History limited by: Not Limited   HPI Tempie Estill BattenFranshasta Brooke DareKing is a 42 y.o. female who presents to the emergency department today because of headache.  It has been present for months.  It does wax and wane.  It is located throughout her head.  She denies a single place where it originates from.  She has had some nausea.  Denies any vision changes.  States came in today because she can no longer take it.  She has tried ibuprofen which typically helps resolve the headaches.     Per medical record review patient has a history of hypertension, head CT 2 years ago which was negative after headache.  Past Medical History:  Diagnosis Date  . Anemia   . Bipolar disorder (HCC)   . Hypertension     Patient Active Problem List   Diagnosis Date Noted  . Bipolar I disorder, single manic episode, severe, with psychosis (HCC) 01/14/2018  . Cannabis abuse   . Vitamin B12 deficiency 06/08/2016  . Cocaine use disorder, moderate, dependence (HCC) 06/07/2016  . Tobacco use disorder 06/07/2016  . Anemia 06/07/2016  . Bipolar I disorder, current or most recent episode manic, with psychotic features (HCC) 06/06/2016  . Cocaine abuse (HCC) 08/04/2015  . Dysmenorrhea 08/04/2015  . Hypertension 08/04/2015    Past Surgical History:  Procedure Laterality Date  . CESAREAN SECTION      Prior to Admission medications   Medication Sig Start Date End Date Taking? Authorizing Provider  benztropine (COGENTIN) 0.5 MG tablet Take 1 tablet (0.5 mg total) by mouth 2 (two) times daily. 01/20/18   McNew, Ileene HutchinsonHolly R, MD  doxycycline (VIBRAMYCIN) 100 MG capsule Take 1 capsule (100 mg total) by mouth 2 (two) times daily. 05/14/18   Sharman CheekStafford, Phillip, MD  ferrous sulfate 325 (65 FE) MG tablet Take 1  tablet (325 mg total) by mouth 2 (two) times daily with a meal. 01/20/18   McNew, Ileene HutchinsonHolly R, MD  haloperidol (HALDOL) 2 MG tablet Take 1 tablet (2 mg total) by mouth at bedtime. 01/20/18   McNew, Ileene HutchinsonHolly R, MD  haloperidol (HALDOL) 5 MG tablet Take 1 tablet (5 mg total) by mouth every morning. 01/21/18   McNew, Ileene HutchinsonHolly R, MD  metroNIDAZOLE (FLAGYL) 500 MG tablet Take 1 tablet (500 mg total) by mouth 2 (two) times daily. 05/14/18   Sharman CheekStafford, Phillip, MD  ondansetron (ZOFRAN ODT) 4 MG disintegrating tablet Take 1 tablet (4 mg total) by mouth every 8 (eight) hours as needed for nausea or vomiting. 05/14/18   Sharman CheekStafford, Phillip, MD  traZODone (DESYREL) 100 MG tablet Take 1 tablet (100 mg total) by mouth at bedtime as needed for sleep. 01/20/18   McNew, Ileene HutchinsonHolly R, MD  vitamin B-12 1000 MCG tablet Take 1 tablet (1,000 mcg total) by mouth daily. Patient not taking: Reported on 11/17/2016 06/08/16   Shari ProwsPucilowska, Jolanta B, MD    Allergies Patient has no known allergies.  No family history on file.  Social History Social History   Tobacco Use  . Smoking status: Current Some Day Smoker    Packs/day: 0.50    Types: Cigarettes  . Smokeless tobacco: Never Used  Substance Use Topics  . Alcohol use: No  . Drug use: Yes    Types: Marijuana, Cocaine    Review of Systems Constitutional: No fever/chills  Eyes: No visual changes. ENT: No sore throat. Cardiovascular: Denies chest pain. Respiratory: Denies shortness of breath. Gastrointestinal: Positive for nausea Genitourinary: Negative for dysuria. Musculoskeletal: Negative for back pain. Skin: Negative for rash. Neurological: Positive for headache ____________________________________________   PHYSICAL EXAM:  VITAL SIGNS: ED Triage Vitals [05/24/18 0919]  Enc Vitals Group     BP (!) 140/98     Pulse Rate 89     Resp 18     Temp 98.7 F (37.1 C)     Temp Source Oral     SpO2 99 %     Weight 130 lb (59 kg)     Height 5\' 3"  (1.6 m)     Head Circumference       Peak Flow      Pain Score 10   Constitutional: Alert and oriented.  Eyes: Conjunctivae are normal.  ENT      Head: Normocephalic and atraumatic.      Nose: No congestion/rhinnorhea.      Mouth/Throat: Mucous membranes are moist.      Neck: No stridor. Hematological/Lymphatic/Immunilogical: No cervical lymphadenopathy. Cardiovascular: Normal rate, regular rhythm.  No murmurs, rubs, or gallops.  Respiratory: Normal respiratory effort without tachypnea nor retractions. Breath sounds are clear and equal bilaterally. No wheezes/rales/rhonchi. Gastrointestinal: Soft and non tender. No rebound. No guarding.  Genitourinary: Deferred Musculoskeletal: Normal range of motion in all extremities. No lower extremity edema. Neurologic:  Normal speech and language. No gross focal neurologic deficits are appreciated.  Skin:  Skin is warm, dry and intact. No rash noted. Psychiatric: Mood and affect are normal. Speech and behavior are normal. Patient exhibits appropriate insight and judgment.  ____________________________________________    LABS (pertinent positives/negatives)  None  ____________________________________________   EKG  None  ____________________________________________    RADIOLOGY  None  ____________________________________________   PROCEDURES  Procedures  ____________________________________________   INITIAL IMPRESSION / ASSESSMENT AND PLAN / ED COURSE  Pertinent labs & imaging results that were available during my care of the patient were reviewed by me and considered in my medical decision making (see chart for details).   Patient presented to the emergency department today because of headache.  She states it is been going on for months.  The patient had no focal neuro deficits on exam.  Did feel better after Fioricet.  Will discharge follow-up with primary care.  ____________________________________________   FINAL CLINICAL IMPRESSION(S) / ED  DIAGNOSES  Final diagnoses:  Bad headache     Note: This dictation was prepared with Dragon dictation. Any transcriptional errors that result from this process are unintentional     Phineas Semen, MD 05/24/18 1233

## 2018-05-24 NOTE — ED Triage Notes (Signed)
PT arrived via ems with complaints of persistent headache for the last "few months." Pt is alert & oriented x 4. No neurological deficits noted in triage.

## 2018-05-24 NOTE — ED Notes (Signed)
Pt reports feeling nauseated. MD made aware. See MAR for administered medication.

## 2018-05-24 NOTE — ED Notes (Signed)
Pt calls out stating she wants something for pain. Pt made aware she had to see MD first.

## 2018-05-24 NOTE — ED Notes (Signed)
Pt calls out stating she hurts so bad she is "going to pass out." Pt helped to reposition. Vitals WDL

## 2019-01-27 ENCOUNTER — Encounter: Payer: Self-pay | Admitting: Emergency Medicine

## 2019-01-27 ENCOUNTER — Other Ambulatory Visit: Payer: Self-pay

## 2019-01-27 ENCOUNTER — Emergency Department
Admission: EM | Admit: 2019-01-27 | Discharge: 2019-01-28 | Disposition: A | Discharge status: Home / Self Care | Payer: Self-pay | Attending: Emergency Medicine | Admitting: Emergency Medicine

## 2019-01-27 DIAGNOSIS — F17210 Nicotine dependence, cigarettes, uncomplicated: Secondary | ICD-10-CM | POA: Insufficient documentation

## 2019-01-27 DIAGNOSIS — I10 Essential (primary) hypertension: Secondary | ICD-10-CM | POA: Insufficient documentation

## 2019-01-27 DIAGNOSIS — F209 Schizophrenia, unspecified: Secondary | ICD-10-CM | POA: Insufficient documentation

## 2019-01-27 DIAGNOSIS — Z79899 Other long term (current) drug therapy: Secondary | ICD-10-CM | POA: Insufficient documentation

## 2019-01-27 DIAGNOSIS — F4325 Adjustment disorder with mixed disturbance of emotions and conduct: Secondary | ICD-10-CM | POA: Insufficient documentation

## 2019-01-27 HISTORY — DX: Schizophrenia, unspecified: F20.9

## 2019-01-27 LAB — COMPREHENSIVE METABOLIC PANEL
ALK PHOS: 56 U/L (ref 38–126)
ALT: 10 U/L (ref 0–44)
ANION GAP: 8 (ref 5–15)
AST: 18 U/L (ref 15–41)
Albumin: 4.2 g/dL (ref 3.5–5.0)
BUN: 7 mg/dL (ref 6–20)
CALCIUM: 9.1 mg/dL (ref 8.9–10.3)
CO2: 24 mmol/L (ref 22–32)
Chloride: 106 mmol/L (ref 98–111)
Creatinine, Ser: 0.82 mg/dL (ref 0.44–1.00)
GFR calc Af Amer: >60 mL/min (ref >60)
GFR calc non Af Amer: >60 mL/min (ref >60)
GLUCOSE: 102 mg/dL — AB (ref 70–99)
Potassium: 3.3 mmol/L — ABNORMAL LOW (ref 3.5–5.1)
Sodium: 138 mmol/L (ref 135–145)
TOTAL PROTEIN: 7.8 g/dL (ref 6.5–8.1)
Total Bilirubin: 0.4 mg/dL (ref 0.3–1.2)

## 2019-01-27 LAB — CBC
HEMATOCRIT: 34.8 % — AB (ref 36.0–46.0)
HEMOGLOBIN: 10.6 g/dL — AB (ref 12.0–15.0)
MCH: 22.6 pg — ABNORMAL LOW (ref 26.0–34.0)
MCHC: 30.5 g/dL (ref 30.0–36.0)
MCV: 74.4 fL — ABNORMAL LOW (ref 80.0–100.0)
Platelets: 323 10*3/uL (ref 150–400)
RBC: 4.68 MIL/uL (ref 3.87–5.11)
RDW: 18.3 % — AB (ref 11.5–15.5)
WBC: 7.2 10*3/uL (ref 4.0–10.5)
nRBC: 0 % (ref 0.0–0.2)

## 2019-01-27 LAB — SALICYLATE LEVEL: Salicylate Lvl: <7 mg/dL (ref 2.8–30.0)

## 2019-01-27 LAB — ETHANOL: Alcohol, Ethyl (B): <10 mg/dL (ref <10)

## 2019-01-27 LAB — URINE DRUG SCREEN, QUALITATIVE (ARMC ONLY)
Amphetamines, Ur Screen: NOT DETECTED
BARBITURATES, UR SCREEN: NOT DETECTED
Benzodiazepine, Ur Scrn: NOT DETECTED
COCAINE METABOLITE, UR ~~LOC~~: NOT DETECTED
Cannabinoid 50 Ng, Ur ~~LOC~~: POSITIVE — AB
MDMA (Ecstasy)Ur Screen: NOT DETECTED
Methadone Scn, Ur: NOT DETECTED
OPIATE, UR SCREEN: NOT DETECTED
Phencyclidine (PCP) Ur S: NOT DETECTED
TRICYCLIC, UR SCREEN: NOT DETECTED

## 2019-01-27 LAB — ACETAMINOPHEN LEVEL

## 2019-01-27 LAB — POCT PREGNANCY, URINE: Preg Test, Ur: NEGATIVE

## 2019-01-27 NOTE — ED Notes (Signed)
Pt changed in to purple scrubs per protocol; clothing, tennis shoes, gray lighter and white head wrap placed in belonging bags; no cell phone, purse/wallet, glasses, money or jewelry on patient;
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
## 2019-01-27 NOTE — ED Notes (Signed)
Phone report given to Matt, RN 
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
## 2019-01-27 NOTE — ED Notes (Signed)
Pt. Moved to interview room to get ready to talk to [facility].
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
## 2019-01-28 NOTE — ED Notes (Signed)
Pt. Going to lobby to call for ride.

## 2019-01-28 NOTE — ED Notes (Signed)
Pt. Moved from [facility] to 19H to decrease stimulus to patient and patient in room #21.

## 2019-01-28 NOTE — Discharge Instructions (Signed)

## 2019-01-28 NOTE — ED Notes (Signed)
SOC called report given, pt. Moved to interview room.

## 2019-01-28 NOTE — ED Provider Notes (Signed)
Patient has been seen and cleared for discharge by psychiatry.  IVC has been rescinded by psychiatrist.  They recommend that the patient have outpatient resources provided which we have done, including follow-up with her primary doctor and recommendation to seek further care at [facility] in [place].  Return precautions and treatment recommendations and follow-up discussed with the patient who is agreeable with the plan.  Patient is awake, ambulatory, fully alert no distress.  Calm and pleasant at this time.  Appears appropriate for discharge   Sharyn Creamer, MD 01/28/19 0202

## 2019-10-10 ENCOUNTER — Encounter: Payer: Self-pay | Admitting: *Deleted

## 2019-10-10 ENCOUNTER — Emergency Department
Admission: EM | Admit: 2019-10-10 | Discharge: 2019-10-11 | Disposition: A | Discharge status: Other Acute Inpatient Hospital | Payer: Medicaid Other | Attending: Emergency Medicine | Admitting: Emergency Medicine

## 2019-10-10 ENCOUNTER — Other Ambulatory Visit: Payer: Self-pay

## 2019-10-10 DIAGNOSIS — F312 Bipolar disorder, current episode manic severe with psychotic features: Secondary | ICD-10-CM | POA: Insufficient documentation

## 2019-10-10 DIAGNOSIS — E538 Deficiency of other specified B group vitamins: Secondary | ICD-10-CM | POA: Diagnosis present

## 2019-10-10 DIAGNOSIS — F17200 Nicotine dependence, unspecified, uncomplicated: Secondary | ICD-10-CM | POA: Diagnosis present

## 2019-10-10 DIAGNOSIS — F17210 Nicotine dependence, cigarettes, uncomplicated: Secondary | ICD-10-CM | POA: Insufficient documentation

## 2019-10-10 DIAGNOSIS — I10 Essential (primary) hypertension: Secondary | ICD-10-CM | POA: Insufficient documentation

## 2019-10-10 DIAGNOSIS — Z79899 Other long term (current) drug therapy: Secondary | ICD-10-CM | POA: Insufficient documentation

## 2019-10-10 DIAGNOSIS — Z20828 Contact with and (suspected) exposure to other viral communicable diseases: Secondary | ICD-10-CM | POA: Insufficient documentation

## 2019-10-10 DIAGNOSIS — F1420 Cocaine dependence, uncomplicated: Secondary | ICD-10-CM | POA: Diagnosis present

## 2019-10-10 DIAGNOSIS — R4689 Other symptoms and signs involving appearance and behavior: Secondary | ICD-10-CM

## 2019-10-10 DIAGNOSIS — F250 Schizoaffective disorder, bipolar type: Secondary | ICD-10-CM | POA: Diagnosis present

## 2019-10-10 DIAGNOSIS — N946 Dysmenorrhea, unspecified: Secondary | ICD-10-CM | POA: Diagnosis present

## 2019-10-10 DIAGNOSIS — D649 Anemia, unspecified: Secondary | ICD-10-CM | POA: Diagnosis present

## 2019-10-10 DIAGNOSIS — F1410 Cocaine abuse, uncomplicated: Secondary | ICD-10-CM | POA: Diagnosis present

## 2019-10-10 DIAGNOSIS — F302 Manic episode, severe with psychotic symptoms: Secondary | ICD-10-CM | POA: Diagnosis present

## 2019-10-10 DIAGNOSIS — F1210 Cannabis abuse, uncomplicated: Secondary | ICD-10-CM | POA: Diagnosis present

## 2019-10-10 LAB — URINE DRUG SCREEN, QUALITATIVE (ARMC ONLY)
Amphetamines, Ur Screen: NOT DETECTED
Barbiturates, Ur Screen: NOT DETECTED
Benzodiazepine, Ur Scrn: NOT DETECTED
Cannabinoid 50 Ng, Ur ~~LOC~~: NOT DETECTED
Cocaine Metabolite,Ur ~~LOC~~: NOT DETECTED
MDMA (Ecstasy)Ur Screen: NOT DETECTED
Methadone Scn, Ur: NOT DETECTED
Opiate, Ur Screen: NOT DETECTED
Phencyclidine (PCP) Ur S: NOT DETECTED
Tricyclic, Ur Screen: NOT DETECTED

## 2019-10-10 LAB — COMPREHENSIVE METABOLIC PANEL
ALT: 10 U/L (ref 0–44)
AST: 17 U/L (ref 15–41)
Albumin: 4.1 g/dL (ref 3.5–5.0)
Alkaline Phosphatase: 62 U/L (ref 38–126)
Anion gap: 8 (ref 5–15)
BUN: 8 mg/dL (ref 6–20)
CO2: 23 mmol/L (ref 22–32)
Calcium: 9.2 mg/dL (ref 8.9–10.3)
Chloride: 106 mmol/L (ref 98–111)
Creatinine, Ser: 0.76 mg/dL (ref 0.44–1.00)
GFR calc Af Amer: >60 mL/min (ref >60)
GFR calc non Af Amer: >60 mL/min (ref >60)
Glucose, Bld: 110 mg/dL — ABNORMAL HIGH (ref 70–99)
Potassium: 3.3 mmol/L — ABNORMAL LOW (ref 3.5–5.1)
Sodium: 137 mmol/L (ref 135–145)
Total Bilirubin: 0.6 mg/dL (ref 0.3–1.2)
Total Protein: 8.2 g/dL — ABNORMAL HIGH (ref 6.5–8.1)

## 2019-10-10 LAB — SALICYLATE LEVEL: Salicylate Lvl: <7 mg/dL (ref 2.8–30.0)

## 2019-10-10 LAB — CBC
HCT: 36.1 % (ref 36.0–46.0)
Hemoglobin: 11.1 g/dL — ABNORMAL LOW (ref 12.0–15.0)
MCH: 23.1 pg — ABNORMAL LOW (ref 26.0–34.0)
MCHC: 30.7 g/dL (ref 30.0–36.0)
MCV: 75.2 fL — ABNORMAL LOW (ref 80.0–100.0)
Platelets: 296 10*3/uL (ref 150–400)
RBC: 4.8 MIL/uL (ref 3.87–5.11)
RDW: 18.5 % — ABNORMAL HIGH (ref 11.5–15.5)
WBC: 8.8 10*3/uL (ref 4.0–10.5)
nRBC: 0 % (ref 0.0–0.2)

## 2019-10-10 LAB — ETHANOL: Alcohol, Ethyl (B): <10 mg/dL (ref <10)

## 2019-10-10 LAB — ACETAMINOPHEN LEVEL: Acetaminophen (Tylenol), Serum: <10 ug/mL — ABNORMAL LOW (ref 10–30)

## 2019-10-10 MED ORDER — HALOPERIDOL 2 MG PO TABS
2.0000 mg | ORAL_TABLET | Freq: Every day | ORAL | Status: DC
  Administered 2019-10-10: 2 mg via ORAL
  Filled 2019-10-10 (×2): qty 1

## 2019-10-10 MED ORDER — TRAZODONE HCL 100 MG PO TABS: 100.0000 mg | ORAL_TABLET | Freq: Every evening | ORAL | Status: DC | PRN

## 2019-10-10 MED ORDER — FERROUS SULFATE 325 (65 FE) MG PO TABS
325.0000 mg | ORAL_TABLET | Freq: Two times a day (BID) | ORAL | Status: DC
  Administered 2019-10-11: 325 mg via ORAL
  Filled 2019-10-10 (×2): qty 1

## 2019-10-10 MED ORDER — HALOPERIDOL 5 MG PO TABS
5.0000 mg | ORAL_TABLET | ORAL | Status: DC
  Administered 2019-10-11: 10:00:00 5 mg via ORAL
  Filled 2019-10-10: qty 1

## 2019-10-10 MED ORDER — BENZTROPINE MESYLATE 1 MG PO TABS
0.5000 mg | ORAL_TABLET | Freq: Two times a day (BID) | ORAL | Status: DC
  Administered 2019-10-10 – 2019-10-11 (×2): 0.5 mg via ORAL
  Filled 2019-10-10 (×2): qty 1
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
## 2019-10-10 NOTE — ED Notes (Signed)
Pt. Transferred from Triage to room 21 after dressing out and screening for contraband. Pt. Oriented to Quad including Q15 minute rounds as well as Engineer, drilling for their protection. Patient is alert, warm and dry in no acute distress. Patient denies SI, HI, and AVH. Pt. Encouraged to let me know if needs arise.

## 2019-10-10 NOTE — ED Notes (Signed)
Pt. Introduced to unit.  Pt. Familiar with unit, pt. Reminded of rules to bathroom, safety checks, and cameras.

## 2019-10-10 NOTE — ED Notes (Signed)
Hourly rounding reveals patient sleeping in room. No complaints, stable, in no acute distress. Q15 minute rounds and monitoring via Rover and Officer to continue.  

## 2019-10-10 NOTE — ED Notes (Signed)
Hourly rounding reveals patient in room. No complaints, stable, in no acute distress. Q15 minute rounds and monitoring via Rover and Officer to continue.   

## 2019-10-11 ENCOUNTER — Inpatient Hospital Stay
Admission: RE | Admit: 2019-10-11 | Discharge: 2019-10-18 | DRG: 885 | Disposition: A | Discharge status: Home / Self Care | Payer: No Typology Code available for payment source | Source: Intra-hospital | Attending: Psychiatry | Admitting: Psychiatry

## 2019-10-11 ENCOUNTER — Other Ambulatory Visit: Payer: Self-pay

## 2019-10-11 DIAGNOSIS — G4700 Insomnia, unspecified: Secondary | ICD-10-CM | POA: Diagnosis present

## 2019-10-11 DIAGNOSIS — F250 Schizoaffective disorder, bipolar type: Secondary | ICD-10-CM | POA: Diagnosis present

## 2019-10-11 DIAGNOSIS — F1410 Cocaine abuse, uncomplicated: Secondary | ICD-10-CM | POA: Diagnosis present

## 2019-10-11 DIAGNOSIS — Z23 Encounter for immunization: Secondary | ICD-10-CM | POA: Diagnosis not present

## 2019-10-11 DIAGNOSIS — F17210 Nicotine dependence, cigarettes, uncomplicated: Secondary | ICD-10-CM | POA: Diagnosis present

## 2019-10-11 DIAGNOSIS — I10 Essential (primary) hypertension: Secondary | ICD-10-CM | POA: Diagnosis present

## 2019-10-11 LAB — SARS CORONAVIRUS 2 (TAT 6-24 HRS): SARS Coronavirus 2: NEGATIVE

## 2019-10-11 MED ORDER — HALOPERIDOL 5 MG PO TABS
5.0000 mg | ORAL_TABLET | ORAL | Status: DC
  Administered 2019-10-12 – 2019-10-18 (×7): 5 mg via ORAL
  Filled 2019-10-11 (×7): qty 1

## 2019-10-11 MED ORDER — VITAMIN B-12 1000 MCG PO TABS
1000.0000 ug | ORAL_TABLET | Freq: Every day | ORAL | Status: DC
  Administered 2019-10-11 – 2019-10-18 (×8): 1000 ug via ORAL
  Filled 2019-10-11 (×8): qty 1

## 2019-10-11 MED ORDER — FERROUS SULFATE 325 (65 FE) MG PO TABS
325.0000 mg | ORAL_TABLET | Freq: Two times a day (BID) | ORAL | Status: DC
  Administered 2019-10-11 – 2019-10-18 (×14): 325 mg via ORAL
  Filled 2019-10-11 (×14): qty 1

## 2019-10-11 MED ORDER — ACETAMINOPHEN 325 MG PO TABS
650.0000 mg | ORAL_TABLET | Freq: Four times a day (QID) | ORAL | Status: DC | PRN
  Administered 2019-10-15 – 2019-10-18 (×6): 650 mg via ORAL
  Filled 2019-10-11 (×6): qty 2

## 2019-10-11 MED ORDER — INFLUENZA VAC SPLIT QUAD 0.5 ML IM SUSY
0.5000 mL | PREFILLED_SYRINGE | INTRAMUSCULAR | Status: AC
  Administered 2019-10-12: 0.5 mL via INTRAMUSCULAR
  Filled 2019-10-11: qty 0.5

## 2019-10-11 MED ORDER — TRAZODONE HCL 100 MG PO TABS
100.0000 mg | ORAL_TABLET | Freq: Every evening | ORAL | Status: DC | PRN
  Administered 2019-10-11 – 2019-10-17 (×4): 100 mg via ORAL
  Filled 2019-10-11 (×4): qty 1

## 2019-10-11 MED ORDER — HALOPERIDOL 1 MG PO TABS
2.0000 mg | ORAL_TABLET | Freq: Every day | ORAL | Status: DC
  Administered 2019-10-11 – 2019-10-17 (×6): 2 mg via ORAL
  Filled 2019-10-11 (×6): qty 2

## 2019-10-11 MED ORDER — MAGNESIUM HYDROXIDE 400 MG/5ML PO SUSP: 30.0000 mL | Freq: Every day | ORAL | Status: DC | PRN

## 2019-10-11 MED ORDER — BENZTROPINE MESYLATE 1 MG PO TABS
0.5000 mg | ORAL_TABLET | Freq: Two times a day (BID) | ORAL | Status: DC
  Administered 2019-10-11 – 2019-10-18 (×14): 0.5 mg via ORAL
  Filled 2019-10-11 (×14): qty 1

## 2019-10-11 MED ORDER — ALUM & MAG HYDROXIDE-SIMETH 200-200-20 MG/5ML PO SUSP: 30.0000 mL | ORAL | Status: DC | PRN

## 2019-10-11 NOTE — ED Notes (Signed)
Pt discharged under IVC to BMU.  VS stable.  Report given to Farmer, Therapist, sports.  Pt accepting of disposition. Belongings will be sent with patient. Pt calm and cooperative.

## 2019-10-11 NOTE — BH Assessment (Signed)
Patient is to be admitted to [facility] by Psychiatric Nurse Practitioner Waylan Boga.  Attending Physician will be Dr. Mallie Darting.   Patient has been assigned to room 319, by [place].   ER staff is aware of the admission:  Dr. Kerman Passey, ER MD   Amy B., Patient's Nurse   Gust Rung., Patient Access.
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
## 2019-10-11 NOTE — Plan of Care (Signed)
Patient compliant with medication administration per MD orders  Problem: Education: Goal: Knowledge of the prescribed therapeutic regimen will improve Outcome: Progressing   

## 2019-10-11 NOTE — Progress Notes (Signed)
Patient was in her room upon arrival to the unit. Patient pleasant during assessment denying SI/HI/AVH, pain. Patient endorses anxiety and depression. Patient observed interacting appropriately with staff and peers on the unit. Patient compliant with medication administration per MD orders. Patient informed to let staff know if there are any issues or problems on the unit. Patient given education. Patient given support and encouragement to be active in her treatment plan. Patient being monitored Q 15 minutes for safety per unit protocol. Patient remains safe on the unit.

## 2019-10-11 NOTE — Plan of Care (Signed)
New admission  . Patient has not begun working on  plan of care Problem: Education: Goal: Knowledge of [place] Education information/materials will improve Outcome: Not Progressing   Problem: Safety: Goal: Periods of time without injury will increase Outcome: Not Progressing   Problem: Education: Goal: Ability to verbalize precipitating factors for violent behavior will improve Outcome: Not Progressing   Problem: Education: Goal: Knowledge of the prescribed therapeutic regimen will improve Outcome: Not Progressing

## 2019-10-11 NOTE — ED Provider Notes (Signed)
-----------------------------------------   6:45 AM on 10/11/2019 -----------------------------------------   Blood pressure (!) 147/80, pulse 92, temperature 98.4 F (36.9 C), temperature source Oral, resp. rate 16, height 5\' 3"  (1.6 m), weight 59.9 kg, last menstrual period 09/19/2019, SpO2 100 %.  The patient is sleeping at this time.  There have been no acute events since the last update.  Awaiting disposition plan from Behavioral Medicine and/or Social Work team(s).   Paulette Blanch, MD 10/11/19 [phone]

## 2019-10-11 NOTE — ED Provider Notes (Signed)
-----------------------------------------   12:34 PM on 10/11/2019 -----------------------------------------  Patient has been seen and evaluated by psychiatry they will be excepting to their service once a bed becomes available.   Harvest Dark, MD 10/11/19 1234
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
## 2019-10-12 DIAGNOSIS — F250 Schizoaffective disorder, bipolar type: Principal | ICD-10-CM

## 2019-10-12 LAB — TSH: TSH: 1.953 u[IU]/mL (ref 0.350–4.500)

## 2019-10-12 LAB — PREGNANCY, URINE: Preg Test, Ur: NEGATIVE

## 2019-10-12 NOTE — [facility] Group Notes (Signed)
LCSW Group Therapy Note  10/12/2019 1:00 PM  Type of Therapy/Topic:  Group Therapy:  Balance in Life  Participation Level:  Did Not Attend  Description of Group:    This group will address the concept of balance and how it feels and looks when one is unbalanced. Patients will be encouraged to process areas in their lives that are out of balance and identify reasons for remaining unbalanced. Facilitators will guide patients in utilizing problem-solving interventions to address and correct the stressor making their life unbalanced. Understanding and applying boundaries will be explored and addressed for obtaining and maintaining a balanced life. Patients will be encouraged to explore ways to assertively make their unbalanced needs known to significant others in their lives, using other group members and facilitator for support and feedback.  Therapeutic Goals: 1. Patient will identify two or more emotions or situations they have that consume much of in their lives. 2. Patient will identify signs/triggers that life has become out of balance:  3. Patient will identify two ways to set boundaries in order to achieve balance in their lives:  4. Patient will demonstrate ability to communicate their needs through discussion and/or role plays  Summary of Patient Progress: X     Therapeutic Modalities:   Cognitive Behavioral Therapy Solution-Focused Therapy Assertiveness Training  Eller Sweis MSW, LCSW 10/12/2019 12:31 PM 

## 2019-10-12 NOTE — Progress Notes (Signed)
Recreation Therapy Notes  INPATIENT RECREATION THERAPY ASSESSMENT  Patient Details Name: Annette Garrison MRN: [id number] DOB: August 01, 1976 Today's Date: 10/12/2019       Information Obtained From: Patient  Able to Participate in Assessment/Interview: Yes  Patient Presentation: Responsive  Reason for Admission (Per Patient): Active Symptoms  Patient Stressors:    Coping Skills:   Building control surveyor, Avoidance  Leisure Interests (2+):  Sports - Dance, Music - Singing, Music - Listen  Frequency of Recreation/Participation: Weekly  Awareness of Community Resources:     [employer]:     Current Use:    If no, Barriers?:    Expressed Interest in [employer] Information:    [employer] of Residence:  Insurance underwriter  Patient Main Form of Transportation: Walk  Patient Strengths:  Talking and laughing with people  Patient Identified Areas of Improvement:  Stop arguing with people  Patient Goal for Hospitalization:  To get out and start working out  Current SI (including self-harm):  No  Current HI:  No  Current AVH: No  Staff Intervention Plan: Group Attendance, Collaborate with Interdisciplinary Treatment Team  Consent to Intern Participation: N/A  Malaak Stach 10/12/2019, 1:36 PM

## 2019-10-12 NOTE — Tx Team (Signed)
Interdisciplinary Treatment and Diagnostic Plan Update  10/12/2019 Time of Session: 9:30AM Annette Garrison MRN: [id number]  Principal Diagnosis: <principal problem not specified>  Secondary Diagnoses: Active Problems:   Schizoaffective disorder, bipolar type (HCC)   Current Medications:  Current Facility-Administered Medications  Medication Dose Route Frequency Provider Last Rate Last Dose  . acetaminophen (TYLENOL) tablet 650 mg  650 mg Oral Q6H PRN Patrecia Pour, NP      . alum & mag hydroxide-simeth (MAALOX/MYLANTA) 200-200-20 MG/5ML suspension 30 mL  30 mL Oral Q4H PRN Patrecia Pour, NP      . benztropine (COGENTIN) tablet 0.5 mg  0.5 mg Oral BID Patrecia Pour, NP   0.5 mg at 10/12/19 0900  . ferrous sulfate tablet 325 mg  325 mg Oral BID WC Patrecia Pour, NP   325 mg at 10/12/19 0900  . haloperidol (HALDOL) tablet 2 mg  2 mg Oral QHS Patrecia Pour, NP   2 mg at 10/11/19 2118  . haloperidol (HALDOL) tablet 5 mg  5 mg Oral Aurora Mask, NP   5 mg at 10/12/19 0700  . magnesium hydroxide (MILK OF MAGNESIA) suspension 30 mL  30 mL Oral Daily PRN Patrecia Pour, NP      . traZODone (DESYREL) tablet 100 mg  100 mg Oral QHS PRN Patrecia Pour, NP   100 mg at 10/11/19 2118  . vitamin B-12 (CYANOCOBALAMIN) tablet 1,000 mcg  1,000 mcg Oral Daily Patrecia Pour, NP   1,000 mcg at 10/12/19 0900   PTA Medications: Medications Prior to Admission  Medication Sig Dispense Refill Last Dose  . benztropine (COGENTIN) 0.5 MG tablet Take 1 tablet (0.5 mg total) by mouth 2 (two) times daily. (Patient not taking: Reported on 10/10/2019) 60 tablet 1 Not Taking at Unknown time  . ferrous sulfate 325 (65 FE) MG tablet Take 1 tablet (325 mg total) by mouth 2 (two) times daily with a meal. (Patient not taking: Reported on 10/10/2019) 60 tablet 1 Not Taking at Unknown time  . haloperidol (HALDOL) 2 MG tablet Take 1 tablet (2 mg total) by mouth at bedtime. (Patient not taking:  Reported on 10/10/2019) 30 tablet 1 Not Taking at Unknown time  . haloperidol (HALDOL) 5 MG tablet Take 1 tablet (5 mg total) by mouth every morning. (Patient not taking: Reported on 10/10/2019) 30 tablet 1 Not Taking at Unknown time  . traZODone (DESYREL) 100 MG tablet Take 1 tablet (100 mg total) by mouth at bedtime as needed for sleep. (Patient not taking: Reported on 10/10/2019) 30 tablet 1 Not Taking at Unknown time  . vitamin B-12 1000 MCG tablet Take 1 tablet (1,000 mcg total) by mouth daily. (Patient not taking: Reported on 11/17/2016) 30 tablet 3 Not Taking at Unknown time    Patient Stressors: Financial difficulties Marital or family conflict  Patient Strengths: Active sense of humor Capable of independent living Communication skills Supportive family/friends  Treatment Modalities: Medication Management, Group therapy, Case management,  1 to 1 session with clinician, Psychoeducation, Recreational therapy.   Physician Treatment Plan for Primary Diagnosis: <principal problem not specified> Long Term Goal(s):     Short Term Goals:    Medication Management: Evaluate patient's response, side effects, and tolerance of medication regimen.  Therapeutic Interventions: 1 to 1 sessions, Unit Group sessions and Medication administration.  Evaluation of Outcomes: Not Met  Physician Treatment Plan for Secondary Diagnosis: Active Problems:   Schizoaffective disorder, bipolar type ([place])  Long Term  Goal(s):     Short Term Goals:       Medication Management: Evaluate patient's response, side effects, and tolerance of medication regimen.  Therapeutic Interventions: 1 to 1 sessions, Unit Group sessions and Medication administration.  Evaluation of Outcomes: Not Met   RN Treatment Plan for Primary Diagnosis: <principal problem not specified> Long Term Goal(s): Knowledge of disease and therapeutic regimen to maintain health will improve  Short Term Goals: Ability to demonstrate  self-control, Ability to participate in decision making will improve, Ability to verbalize feelings will improve, Ability to disclose and discuss suicidal ideas and Ability to identify and develop effective coping behaviors will improve  Medication Management: RN will administer medications as ordered by provider, will assess and evaluate patient's response and provide education to patient for prescribed medication. RN will report any adverse and/or side effects to prescribing provider.  Therapeutic Interventions: 1 on 1 counseling sessions, Psychoeducation, Medication administration, Evaluate responses to treatment, Monitor vital signs and CBGs as ordered, Perform/monitor CIWA, COWS, AIMS and Fall Risk screenings as ordered, Perform wound care treatments as ordered.  Evaluation of Outcomes: Not Met   LCSW Treatment Plan for Primary Diagnosis: <principal problem not specified> Long Term Goal(s): Safe transition to appropriate next level of care at discharge, Engage patient in therapeutic group addressing interpersonal concerns.  Short Term Goals: Engage patient in aftercare planning with referrals and resources, Increase social support, Increase ability to appropriately verbalize feelings, Increase emotional regulation and Facilitate acceptance of mental health diagnosis and concerns  Therapeutic Interventions: Assess for all discharge needs, 1 to 1 time with Social worker, Explore available resources and support systems, Assess for adequacy in community support network, Educate family and significant other(s) on suicide prevention, Complete Psychosocial Assessment, Interpersonal group therapy.  Evaluation of Outcomes: Not Met   Progress in Treatment: Attending groups: No. Participating in groups: No. Taking medication as prescribed: Yes. Toleration medication: Yes. Family/Significant other contact made: No, will contact:  pt declined SPE contact at this time.  Patient understands diagnosis:  Yes. Discussing patient identified problems/goals with staff: Yes. Medical problems stabilized or resolved: Yes. Denies suicidal/homicidal ideation: Yes. Issues/concerns per patient self-inventory: No. Other: none  New problem(s) identified: No, Describe:  none  New Short Term/Long Term Goal(s): elimination of symptoms of psychosis, medication management for mood stabilization; elimination of SI thoughts; development of comprehensive mental wellness plan.  Patient Goals:  "get out and meet my friends"  Discharge Plan or Barriers: Patient reports that she would like a referral to [facility].  She reports plans to return to her home with her grandmother.   Reason for Continuation of Hospitalization: Delusions  Hallucinations Mania Medication stabilization  Estimated Length of Stay: 1-7 days  Attendees: Patient: Annette Garrison 10/12/2019 10:49 AM  Physician: Dr. Mallie Darting 10/12/2019 10:49 AM  Nursing: Rosemary Holms, RN 10/12/2019 10:49 AM  RN Care Manager: 10/12/2019 10:49 AM  Social Worker: Assunta Curtis, LCSW 10/12/2019 10:49 AM  Recreational Therapist: Roanna Epley, Reather Converse, LRT 10/12/2019 10:49 AM  Other: Sanjuana Kava, LCSW 10/12/2019 10:49 AM  Other:  10/12/2019 10:49 AM  Other: 10/12/2019 10:49 AM    Scribe for Treatment Team: Rozann Lesches, LCSW 10/12/2019 10:49 AM

## 2019-10-12 NOTE — Plan of Care (Signed)
D: Pt denies SI/HI/AV hallucinations. Pt is pleasant and cooperative. Pt goal for today is to get out, meet her friend and exercise.  A: Pt was offered support and encouragement. Pt was given scheduled medications. Pt was encourage to attend groups. Q 15 minute checks were done for safety.  R: Pt is taking medication. Pt has no complaints.Pt receptive to treatment and safety maintained on unit.    Problem: Education: Goal: Knowledge of [facility] Education information/materials will improve Outcome: Progressing   Problem: Coping: Goal: Ability to verbalize frustrations and anger appropriately will improve Outcome: Progressing Goal: Ability to demonstrate self-control will improve Outcome: Progressing   Problem: Safety: Goal: Periods of time without injury will increase Outcome: Progressing   Problem: Education: Goal: Ability to verbalize precipitating factors for violent behavior will improve Outcome: Progressing   Problem: Education: Goal: Knowledge of the prescribed therapeutic regimen will improve Outcome: Progressing

## 2019-10-12 NOTE — BHH Counselor (Signed)
CSW called to schedule the patient's aftercare appointment, however, the agency was closed Friday, 10/12/2019 in observance of Thanksgiving.  Assunta Curtis, MSW, LCSW 10/12/2019 2:39 PM

## 2019-10-12 NOTE — [facility] Suicide Risk Assessment (Signed)
[facility] INPATIENT:  Family/Significant Other Suicide Prevention Education  Suicide Prevention Education:  Patient Refusal for Family/Significant Other Suicide Prevention Education: The patient Annette Garrison has refused to provide written consent for family/significant other to be provided Family/Significant Other Suicide Prevention Education during admission and/or prior to discharge.  Physician notified.  SPE completed with pt, as pt refused to consent to family contact. SPI pamphlet provided to pt and pt was encouraged to share information with support network, ask questions, and talk about any concerns relating to SPE. Pt denies access to guns/firearms and verbalized understanding of information provided. Mobile Crisis information also provided to pt.   Rozann Lesches 10/12/2019, 10:36 AM
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
## 2019-10-12 NOTE — Progress Notes (Signed)
Recreation Therapy Notes  Date: 10/12/2019  Time: 9:30 am   Location: Craft room   Behavioral response: N/A   Intervention Topic: Necessities   Discussion/Intervention: Patient did not attend group.   Clinical Observations/Feedback:  Patient did not attend group.   Ignatius Kloos LRT/CTRS        Donovin Kraemer 10/12/2019 11:05 AM
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
## 2019-10-13 NOTE — Plan of Care (Signed)
  Problem: Education: Goal: Knowledge of the prescribed therapeutic regimen will improve Outcome: Progressing  Patient appears less psychotic no bizarre behavior during the shift.

## 2019-10-13 NOTE — Progress Notes (Addendum)
[facility] MD Progress Note  10/13/2019 5:06 PM Annette Garrison  MRN:  [id number]  Principal Problem: <principal problem not specified> Diagnosis: Active Problems:   Schizoaffective disorder, bipolar type [facility])  Patient is a 43 year old female with a reported past psychiatric history significant for bipolar disorder, cocaine use disorder, marijuana use disorder who presented to the [facility] emergency department under involuntary commitment for auditory hallucinations, and paranoia in settings of not taking psych medications. Patient admitted to [facility] unit for safety and stabilization.  Patient seen.  Chart reviewed. Patient discussed with nursing; no overnight events reported.  Subjective: patient reports "I feel so much better. I came here for reason. I just needed medications". She reports good mood and significant improvement of anxiety. She reports her hallucinations are "lower" and "they don`t bother me much"; denies any commanding hallucinations. She participates in milieu activities and states she feels like she is "in a right place for me". She is a little paranoid about her previous medical records. She denies suicidal or homicidal thoughts. Denies side effects from medications. Reports good sleep and appetite.  Objective:  Vitals are stable. Patient is afebrile. She slept 7.75h. Labs reviewed: pregnancy test is neg; TSH is 1953.    Total Time spent with patient: 20 minutes    Past Medical History:  Past Medical History:  Diagnosis Date  . Anemia   . Bipolar disorder ([place])   . Hypertension   . Schizophrenia [facility])     Past Surgical History:  Procedure Laterality Date  . CESAREAN SECTION     Family History: History reviewed. No pertinent family history. Family Psychiatric  History: see H&P Social History:  Social History   Substance and Sexual Activity  Alcohol Use No     Social History   Substance and Sexual Activity  Drug Use Yes  . Types:  Marijuana, Cocaine   Comment: no cocaine "in a long time"; last smoked marijuana a couple of days ago    Social History   Socioeconomic History  . Marital status: Single    Spouse name: Not on file  . Number of children: Not on file  . Years of education: Not on file  . Highest education level: Not on file  Occupational History  . Not on file  Social Needs  . Financial resource strain: Not on file  . Food insecurity    Worry: Not on file    Inability: Not on file  . Transportation needs    Medical: Not on file    Non-medical: Not on file  Tobacco Use  . Smoking status: Current Some Day Smoker    Packs/day: 0.50    Types: Cigarettes  . Smokeless tobacco: Never Used  Substance and Sexual Activity  . Alcohol use: No  . Drug use: Yes    Types: Marijuana, Cocaine    Comment: no cocaine "in a long time"; last smoked marijuana a couple of days ago  . Sexual activity: Not Currently  Lifestyle  . Physical activity    Days per week: Not on file    Minutes per session: Not on file  . Stress: Not on file  Relationships  . Social Herbalist on phone: Not on file    Gets together: Not on file    Attends religious service: Not on file    Active member of club or organization: Not on file    Attends meetings of clubs or organizations: Not on file  Relationship status: Not on file  Other Topics Concern  . Not on file  Social History Narrative  . Not on file   Additional Social History:                         Sleep: Fair  Appetite:  Good  Current Medications: Current Facility-Administered Medications  Medication Dose Route Frequency Provider Last Rate Last Dose  . acetaminophen (TYLENOL) tablet 650 mg  650 mg Oral Q6H PRN Charm Rings, NP      . alum & mag hydroxide-simeth (MAALOX/MYLANTA) 200-200-20 MG/5ML suspension 30 mL  30 mL Oral Q4H PRN Charm Rings, NP      . benztropine (COGENTIN) tablet 0.5 mg  0.5 mg Oral BID Charm Rings, NP    0.5 mg at 10/13/19 1610  . ferrous sulfate tablet 325 mg  325 mg Oral BID WC Charm Rings, NP   325 mg at 10/13/19 0831  . haloperidol (HALDOL) tablet 2 mg  2 mg Oral QHS Charm Rings, NP   2 mg at 10/12/19 2126  . haloperidol (HALDOL) tablet 5 mg  5 mg Oral Blenda Mounts, NP   5 mg at 10/13/19 0644  . magnesium hydroxide (MILK OF MAGNESIA) suspension 30 mL  30 mL Oral Daily PRN Charm Rings, NP      . traZODone (DESYREL) tablet 100 mg  100 mg Oral QHS PRN Charm Rings, NP   100 mg at 10/12/19 2126  . vitamin B-12 (CYANOCOBALAMIN) tablet 1,000 mcg  1,000 mcg Oral Daily Charm Rings, NP   1,000 mcg at 10/13/19 9604    Lab Results:  Results for orders placed or performed during the hospital encounter of 10/11/19 (from the past 48 hour(s))  TSH     Status: None   Collection Time: 10/12/19  9:42 AM  Result Value Ref Range   TSH 1.953 0.350 - 4.500 uIU/mL    Comment: Performed by a 3rd Generation assay with a functional sensitivity of <=0.01 uIU/mL. Performed at [facility], [address] [place], [place] [address]   Pregnancy, urine     Status: None   Collection Time: 10/12/19  7:30 PM  Result Value Ref Range   Preg Test, Ur NEGATIVE NEGATIVE    Comment: Performed at [facility], [address] [place], [place] [address]    Blood Alcohol level:  Lab Results  Component Value Date   [facility] <10 10/10/2019   ETH <10 01/27/2019    Metabolic Disorder Labs: Lab Results  Component Value Date   HGBA1C 5.4 01/15/2018   MPG 108.28 01/15/2018   Lab Results  Component Value Date   PROLACTIN 24.4 (H) 06/08/2016   Lab Results  Component Value Date   CHOL 163 01/15/2018   TRIG 82 01/15/2018   HDL 39 (L) 01/15/2018   CHOLHDL 4.2 01/15/2018   VLDL 16 01/15/2018   LDLCALC 108 (H) 01/15/2018   LDLCALC 93 06/08/2016    Physical Findings: AIMS:  , ,  ,  ,    CIWA:    COWS:     Musculoskeletal: Strength & Muscle Tone: within normal  limits Gait & Station: normal Patient leans: N/A  Psychiatric Specialty Exam: Physical Exam  ROS  Blood pressure 116/77, pulse 83, temperature 99.1 F (37.3 C), temperature source Oral, resp. rate 18, height 5\' 3"  (1.6 m), weight 59.4 kg, last menstrual period 09/19/2019, SpO2 100 %.Body mass  index is 23.21 kg/m.  General Appearance: Casual  Eye Contact:  Good  Speech:  Normal Rate  Volume:  Normal  Mood:  Euthymic  Affect:  Congruent  Thought Process:  Coherent  Orientation:  Full (Time, Place, and Person)  Thought Content:  Hallucinations: Auditory  Suicidal Thoughts:  No  Homicidal Thoughts:  No  Memory:  Immediate;   Good Recent;   Fair  Judgement:  Fair  Insight:  Fair  Psychomotor Activity:  Normal  Concentration:  Concentration: Fair and Attention Span: Fair  Recall:  Fair  Fund of Knowledge:  Fair  Language:  Good  Akathisia:  No  Handed:  Right  AIMS (if indicaFiservted):     Assets:  Desire for Improvement Resilience  ADL's:  Intact  Cognition:  WNL  Sleep:  Number of Hours: 7.75     Treatment Plan Summary: Daily contact with patient to assess and evaluate symptoms and progress in treatment   Patient is a 43 year old female with a reported past psychiatric history significant for bipolar disorder, cocaine use disorder, marijuana use disorder who [facility] department under involuntary commitment for auditory hallucinations, and paranoia in settings of not taking psych medications. [facility]. Today patient reports improvement of mood and psychotic symptoms after her antipsychotic was restarted. Reports remaining auditory hallucinations, less intense, non-commanding. Will continue current medications without changes.  Impression: Schizoaffective disorder Substance use disorder  Plan: -continue inpatient psych admission; 15-minute checks; daily contact with patient to  assess and evaluate symptoms and progress in treatment; psychoeducation.  -Medications: Continue Haldol 5 mg in the morning and 2mg  at bedtime for psychotic symptoms. Continue Trazodone prn sleep continue cogentin for possible side effects of antipsychotics. continue ferrous sulfate, cyanocobalamin for nutritional supplementation.  -Disposition plan in in process.   Thalia PartyAlisa Blayn Whetsell, MD 10/13/2019, 5:06 PM

## 2019-10-13 NOTE — Progress Notes (Signed)
Patient alert and oriented x 4, affect is flat, but she brightens upon approach, not interacting appropriately with peers and staff, her thoughts are organized and coherent, she denies SI/HI/AVH no distress noted 15 minutes safety checks maintained will continue to monitor.

## 2019-10-13 NOTE — BHH Group Notes (Signed)
CSW did not have group today on Overcoming Obstacles due to patient's have recreational time and unit acuity.    Kendric Sindelar, MSW, LCSW Clinical Social Worker II  [facility] Phone: [phone] Fax: [phone]  

## 2019-10-13 NOTE — Plan of Care (Signed)
D: Pt during assessments denies SI/HI/AVH. Pt is pleasant and cooperative. Pt. denies pain. Denies having any complaints. No observation of responding to internal stimuli. Pt. Endorses a normal mood.   A: Q x 15 minute observation checks were completed for safety. Patient was provided with education.  Patient was given/offered medications per orders. Patient  was encourage to attend groups, participate in unit activities and continue with plan of care. Pt. Chart and plans of care reviewed. Pt. Given support and encouragement.   R: Patient is complaint with medication and unit procedures. Pt. Observed eating good. No behavioral concerns to report.              Problem: Education: Goal: Knowledge of [facility] Education information/materials will improve Outcome: Progressing   Problem: Coping: Goal: Ability to verbalize frustrations and anger appropriately will improve Outcome: Progressing Goal: Ability to demonstrate self-control will improve Outcome: Progressing   Problem: Safety: Goal: Periods of time without injury will increase Outcome: Progressing   Problem: Education: Goal: Ability to verbalize precipitating factors for violent behavior will improve Outcome: Progressing   Problem: Education: Goal: Knowledge of the prescribed therapeutic regimen will improve Outcome: Progressing

## 2019-10-14 NOTE — Progress Notes (Signed)
Patient alert and oriented x 4, affect is flat but she brightens upon approach, she is  interacting appropriately with peers and staff, her thoughts are organized and coherent, she denies SI/HI/AVH no distress noted, she attended evening wrap up group and was compliant with medication regimen, 15 minutes safety checks maintained will continue to monitor.

## 2019-10-14 NOTE — BHH Group Notes (Signed)
BHH LCSW Group Therapy Note  Date/Time: 10/14/2019 @ 1:30pm  Type of Therapy and Topic:  Group Therapy:  Overcoming Obstacles  Participation Level:  BHH PARTICIPATION LEVEL: Did Not Attend  Description of Group:    In this group patients will be encouraged to explore what they see as obstacles to their own wellness and recovery. They will be guided to discuss their thoughts, feelings, and behaviors related to these obstacles. The group will process together ways to cope with barriers, with attention given to specific choices patients can make. Each patient will be challenged to identify changes they are motivated to make in order to overcome their obstacles. This group will be process-oriented, with patients participating in exploration of their own experiences as well as giving and receiving support and challenge from other group members.  Therapeutic Goals: 1. Patient will identify personal and current obstacles as they relate to admission. 2. Patient will identify barriers that currently interfere with their wellness or overcoming obstacles.  3. Patient will identify feelings, thought process and behaviors related to these barriers. 4. Patient will identify two changes they are willing to make to overcome these obstacles:    Summary of Patient Progress   Patient did not attend group today.     Therapeutic Modalities:   Cognitive Behavioral Therapy Solution Focused Therapy Motivational Interviewing Relapse Prevention Therapy   Ammar Moffatt, LCSW 

## 2019-10-14 NOTE — Plan of Care (Signed)
  Problem: Education: Goal: Knowledge of the prescribed therapeutic regimen will improve Outcome: Progressing  No psychosis noted, she has insight into medication regimen.

## 2019-10-14 NOTE — Progress Notes (Signed)
[facility] MD Progress Note  10/14/2019 11:44 AM Annette Garrison  MRN:  [id number]  Principal Problem: <principal problem not specified> Diagnosis: Active Problems:   Schizoaffective disorder, bipolar type [facility])  Patient is a 43 year old female with a reported past psychiatric history significant for bipolar disorder, cocaine use disorder, marijuana use disorder who presented to the [facility] emergency department under involuntary commitment for auditory hallucinations, and paranoia in settings of not taking psych medications. Patient admitted to [facility] unit for safety and stabilization.  Patient seen.  Chart reviewed. Patient discussed with nursing; no overnight events reported.  Subjective: patient reports "I feel great". She reports good mood and significant improvement of anxiety. She reports her hallucinations "don`t bother me anynore"; denies any commanding hallucinations. She reports mild racing thoughts. She denies suicidal or homicidal thoughts. Denies side effects from medications. Reports good sleep and appetite.  Objective:  Vitals are stable. Patient is afebrile. She slept 6h.  Total Time spent with patient: 20 minutes    Past Medical History:  Past Medical History:  Diagnosis Date  . Anemia   . Bipolar disorder ([place])   . Hypertension   . Schizophrenia [facility])     Past Surgical History:  Procedure Laterality Date  . CESAREAN SECTION     Family History: History reviewed. No pertinent family history. Family Psychiatric  History: see H&P Social History:  Social History   Substance and Sexual Activity  Alcohol Use No     Social History   Substance and Sexual Activity  Drug Use Yes  . Types: Marijuana, Cocaine   Comment: no cocaine "in a long time"; last smoked marijuana a couple of days ago    Social History   Socioeconomic History  . Marital status: Single    Spouse name: Not on file  . Number of children: Not on file  . Years of  education: Not on file  . Highest education level: Not on file  Occupational History  . Not on file  Social Needs  . Financial resource strain: Not on file  . Food insecurity    Worry: Not on file    Inability: Not on file  . Transportation needs    Medical: Not on file    Non-medical: Not on file  Tobacco Use  . Smoking status: Current Some Day Smoker    Packs/day: 0.50    Types: Cigarettes  . Smokeless tobacco: Never Used  Substance and Sexual Activity  . Alcohol use: No  . Drug use: Yes    Types: Marijuana, Cocaine    Comment: no cocaine "in a long time"; last smoked marijuana a couple of days ago  . Sexual activity: Not Currently  Lifestyle  . Physical activity    Days per week: Not on file    Minutes per session: Not on file  . Stress: Not on file  Relationships  . Social Herbalist on phone: Not on file    Gets together: Not on file    Attends religious service: Not on file    Active member of club or organization: Not on file    Attends meetings of clubs or organizations: Not on file    Relationship status: Not on file  Other Topics Concern  . Not on file  Social History Narrative  . Not on file   Additional Social History:  Sleep: Fair  Appetite:  Good  Current Medications: Current Facility-Administered Medications  Medication Dose Route Frequency Provider Last Rate Last Dose  . acetaminophen (TYLENOL) tablet 650 mg  650 mg Oral Q6H PRN Charm Rings, NP      . alum & mag hydroxide-simeth (MAALOX/MYLANTA) 200-200-20 MG/5ML suspension 30 mL  30 mL Oral Q4H PRN Charm Rings, NP      . benztropine (COGENTIN) tablet 0.5 mg  0.5 mg Oral BID Charm Rings, NP   0.5 mg at 10/14/19 0867  . ferrous sulfate tablet 325 mg  325 mg Oral BID WC Charm Rings, NP   325 mg at 10/14/19 6195  . haloperidol (HALDOL) tablet 2 mg  2 mg Oral QHS Charm Rings, NP   2 mg at 10/13/19 2114  . haloperidol (HALDOL) tablet 5 mg   5 mg Oral Blenda Mounts, NP   5 mg at 10/14/19 0932  . magnesium hydroxide (MILK OF MAGNESIA) suspension 30 mL  30 mL Oral Daily PRN Charm Rings, NP      . traZODone (DESYREL) tablet 100 mg  100 mg Oral QHS PRN Charm Rings, NP   100 mg at 10/12/19 2126  . vitamin B-12 (CYANOCOBALAMIN) tablet 1,000 mcg  1,000 mcg Oral Daily Charm Rings, NP   1,000 mcg at 10/14/19 6712    Lab Results:  Results for orders placed or performed during the hospital encounter of 10/11/19 (from the past 48 hour(s))  Pregnancy, urine     Status: None   Collection Time: 10/12/19  7:30 PM  Result Value Ref Range   Preg Test, Ur NEGATIVE NEGATIVE    Comment: Performed at [facility], [address], [place], [place] [address]    Blood Alcohol level:  Lab Results  Component Value Date   [facility] <10 10/10/2019   ETH <10 01/27/2019    Metabolic Disorder Labs: Lab Results  Component Value Date   HGBA1C 5.4 01/15/2018   MPG 108.28 01/15/2018   Lab Results  Component Value Date   PROLACTIN 24.4 (H) 06/08/2016   Lab Results  Component Value Date   CHOL 163 01/15/2018   TRIG 82 01/15/2018   HDL 39 (L) 01/15/2018   CHOLHDL 4.2 01/15/2018   VLDL 16 01/15/2018   LDLCALC 108 (H) 01/15/2018   LDLCALC 93 06/08/2016    Physical Findings: AIMS:  , ,  ,  ,    CIWA:    COWS:      Objective:  Musculoskeletal: Strength & Muscle Tone: within normal limits Gait & Station: normal Patient leans: N/A  Psychiatric Specialty Exam: Physical Exam   ROS   Blood pressure 111/82, pulse 97, temperature 99.2 F (37.3 C), temperature source Oral, resp. rate 16, height 5\' 3"  (1.6 m), weight 59.4 kg, last menstrual period 09/19/2019, SpO2 100 %.Body mass index is 23.21 kg/m.  General Appearance: Casual  Eye Contact:  Good  Speech:  Normal Rate  Volume:  Normal  Mood:  Euthymic  Affect:  Congruent  Thought Process:  Coherent  Orientation:  Full (Time, Place, and Person)  Thought  Content:  Hallucinations: Auditory  Suicidal Thoughts:  No  Homicidal Thoughts:  No  Memory:  Immediate;   Good Recent;   Fair  Judgement:  Fair  Insight:  Fair  Psychomotor Activity:  Normal  Concentration:  Concentration: Fair and Attention Span: Fair  Recall:  13/02/2019 of Knowledge:  Fair  Language:  [employer]  Akathisia:  No  Handed:  Right  AIMS (if indicated):     Assets:  Desire for Improvement Resilience  ADL's:  Intact  Cognition:  WNL  Sleep:  Number of Hours: 6     Treatment Plan Summary: Daily contact with patient to assess and evaluate symptoms and progress in treatment   Patient is a 43 year old female with a reported past psychiatric history significant for bipolar disorder, cocaine use disorder, marijuana use disorder who [facility] under involuntary commitment for auditory hallucinations, and paranoia in settings of not taking psych medications. [facility]. Today patient continues to report improvement of mood and psychotic symptoms. Reports remaining mild auditory hallucinations, less intense, non-commanding - "better every day". Will continue current medications without changes.  Impression: Schizoaffective disorder Substance use disorder  Plan: -continue inpatient psych admission; 15-minute checks; daily contact with patient to assess and evaluate symptoms and progress in treatment; psychoeducation.  -Medications: Continue Haldol 5 mg in the morning and 2mg  at bedtime for psychotic symptoms. Continue Trazodone prn sleep continue cogentin for possible side effects of antipsychotics. continue ferrous sulfate, cyanocobalamin for nutritional supplementation.  -Disposition plan in in process.   Thalia PartyAlisa Kaleia Longhi, MD 10/14/2019, 11:44 AM

## 2019-10-14 NOTE — Plan of Care (Signed)
D- Patient alert and oriented. Patient presents in a pleasant mood on assessment stating that she slept "pretty good" last night and had no complaints to voice to this Probation officer. Patient denies SI, HI, AVH, and pain at this time. Patient also denies any signs/symptoms of depression/anxiety, stating "I don't have none of that", however, she did rate an anxiety level of "1/10" on her self-inventory. Patient also rated her hopelessness an "8/10", but she did not endorse any of this to this Probation officer. Patient had no stated goals for today.  A- Scheduled medications administered to patient, per MD orders. Support and encouragement provided.  Routine safety checks conducted every 15 minutes.  Patient informed to notify staff with problems or concerns.  R- No adverse drug reactions noted. Patient contracts for safety at this time. Patient compliant with medications and treatment plan. Patient receptive, calm, and cooperative. Patient interacts well with others on the unit.  Patient remains safe at this time.  Problem: Education: Goal: Knowledge of [place] [facility] Education information/materials will improve Outcome: Progressing   Problem: Coping: Goal: Ability to verbalize frustrations and anger appropriately will improve Outcome: Progressing Goal: Ability to demonstrate self-control will improve Outcome: Progressing   Problem: Safety: Goal: Periods of time without injury will increase Outcome: Progressing   Problem: Education: Goal: Ability to verbalize precipitating factors for violent behavior will improve Outcome: Progressing   Problem: Education: Goal: Knowledge of the prescribed therapeutic regimen will improve Outcome: Progressing

## 2019-10-15 NOTE — [facility] Group Notes (Signed)
LCSW Group Therapy Note   10/15/2019 11:28 AM   Type of Therapy and Topic:  Group Therapy:  Overcoming Obstacles   Participation Level:  Did Not Attend   Description of Group:    In this group patients will be encouraged to explore what they see as obstacles to their own wellness and recovery. They will be guided to discuss their thoughts, feelings, and behaviors related to these obstacles. The group will process together ways to cope with barriers, with attention given to specific choices patients can make. Each patient will be challenged to identify changes they are motivated to make in order to overcome their obstacles. This group will be process-oriented, with patients participating in exploration of their own experiences as well as giving and receiving support and challenge from other group members.   Therapeutic Goals: 1. Patient will identify personal and current obstacles as they relate to admission. 2. Patient will identify barriers that currently interfere with their wellness or overcoming obstacles.  3. Patient will identify feelings, thought process and behaviors related to these barriers. 4. Patient will identify two changes they are willing to make to overcome these obstacles:      Summary of Patient Progress x     Therapeutic Modalities:   Cognitive Behavioral Therapy Solution Focused Therapy Motivational Interviewing Relapse Prevention Therapy  Evalina Field, MSW, LCSW Clinical Social Work 10/15/2019 11:28 AM

## 2019-10-15 NOTE — Plan of Care (Signed)
D- Patient alert and oriented. Patient presents in a pleasant mood on assessment stating that she slept "perfect" last night and had no complaints to voice to this Probation officer. Patient denies any signs/symptoms of depression and anxiety, reporting that she is "feeling great". Patient also denies SI, HI, AVH, and pain at this time. Patient had no stated goals for today.  A- Scheduled medications administered to patient, per MD orders. Support and encouragement provided.  Routine safety checks conducted every 15 minutes.  Patient informed to notify staff with problems or concerns.  R- No adverse drug reactions noted. Patient contracts for safety at this time. Patient compliant with medications and treatment plan. Patient receptive, calm, and cooperative. Patient interacts well with others on the unit.  Patient remains safe at this time.  Problem: Education: Goal: Knowledge of [place] [facility] Education information/materials will improve Outcome: Progressing   Problem: Coping: Goal: Ability to verbalize frustrations and anger appropriately will improve Outcome: Progressing Goal: Ability to demonstrate self-control will improve Outcome: Progressing   Problem: Safety: Goal: Periods of time without injury will increase Outcome: Progressing   Problem: Education: Goal: Ability to verbalize precipitating factors for violent behavior will improve Outcome: Progressing   Problem: Education: Goal: Knowledge of the prescribed therapeutic regimen will improve Outcome: Progressing

## 2019-10-15 NOTE — Progress Notes (Signed)
Recreation Therapy Notes  Date: 10/15/2019  Time: 9:30 am  Location: Craft room  Behavioral response: Appropriate   Intervention Topic: Stress  Discussion/Intervention:   Group content on today was focused on stress. The group defined stress and way to cope with stress. Participants expressed how they know when they are stresses out. Individuals described the different ways they have to cope with stress. The group stated reasons why it is important to cope with stress. Patient explained what good stress is and some examples. The group participated in the intervention "Stress Management". Individuals were separated into two group and answered questions related to stress.  Clinical Observations/Feedback:  Patient came to group and stated that stress can come from peer pressure, family and children. Individual participated in the intervention and was social with peers and staff during group. Alicia Seib LRT/CTRS         Elka Satterfield 10/15/2019 11:08 AM

## 2019-10-15 NOTE — Progress Notes (Signed)
[facility] MD Progress Note  10/15/2019 6:15 PM Annette Garrison  MRN:  [id number] Subjective: Follow-up for this patient with schizoaffective disorder.  Patient seems like she is a little better organized than when she came in but still admits to having hallucinations and appears to be paranoid and disorganized.  Has been compliant with medicine.  No side effects reported. Principal Problem: <principal problem not specified> Diagnosis: Active Problems:   Schizoaffective disorder, bipolar type ([place])  Total Time spent with patient: 30 minutes  Past Psychiatric History: Patient has a history of schizophrenia and noncompliance.  Has had previous hospitalizations several times  Past Medical History:  Past Medical History:  Diagnosis Date  . Anemia   . Bipolar disorder ([place])   . Hypertension   . Schizophrenia [facility])     Past Surgical History:  Procedure Laterality Date  . CESAREAN SECTION     Family History: History reviewed. No pertinent family history. Family Psychiatric  History: See previous Social History:  Social History   Substance and Sexual Activity  Alcohol Use No     Social History   Substance and Sexual Activity  Drug Use Yes  . Types: Marijuana, Cocaine   Comment: no cocaine "in a long time"; last smoked marijuana a couple of days ago    Social History   Socioeconomic History  . Marital status: Single    Spouse name: Not on file  . Number of children: Not on file  . Years of education: Not on file  . Highest education level: Not on file  Occupational History  . Not on file  Social Needs  . Financial resource strain: Not on file  . Food insecurity    Worry: Not on file    Inability: Not on file  . Transportation needs    Medical: Not on file    Non-medical: Not on file  Tobacco Use  . Smoking status: Current Some Day Smoker    Packs/day: 0.50    Types: Cigarettes  . Smokeless tobacco: Never Used  Substance and Sexual Activity  . Alcohol use: No  .  Drug use: Yes    Types: Marijuana, Cocaine    Comment: no cocaine "in a long time"; last smoked marijuana a couple of days ago  . Sexual activity: Not Currently  Lifestyle  . Physical activity    Days per week: Not on file    Minutes per session: Not on file  . Stress: Not on file  Relationships  . Social Herbalist on phone: Not on file    Gets together: Not on file    Attends religious service: Not on file    Active member of club or organization: Not on file    Attends meetings of clubs or organizations: Not on file    Relationship status: Not on file  Other Topics Concern  . Not on file  Social History Narrative  . Not on file   Additional Social History:                         Sleep: Fair  Appetite:  Fair  Current Medications: Current Facility-Administered Medications  Medication Dose Route Frequency Provider Last Rate Last Dose  . acetaminophen (TYLENOL) tablet 650 mg  650 mg Oral Q6H PRN Patrecia Pour, NP   650 mg at 10/15/19 1153  . alum & mag hydroxide-simeth (MAALOX/MYLANTA) 200-200-20 MG/5ML suspension 30 mL  30 mL Oral Q4H PRN Lord,  Herminio Heads, NP      . benztropine (COGENTIN) tablet 0.5 mg  0.5 mg Oral BID Charm Rings, NP   0.5 mg at 10/15/19 1631  . ferrous sulfate tablet 325 mg  325 mg Oral BID WC Charm Rings, NP   325 mg at 10/15/19 1631  . haloperidol (HALDOL) tablet 2 mg  2 mg Oral QHS Charm Rings, NP   2 mg at 10/14/19 2112  . haloperidol (HALDOL) tablet 5 mg  5 mg Oral Blenda Mounts, NP   5 mg at 10/15/19 1191  . magnesium hydroxide (MILK OF MAGNESIA) suspension 30 mL  30 mL Oral Daily PRN Charm Rings, NP      . traZODone (DESYREL) tablet 100 mg  100 mg Oral QHS PRN Charm Rings, NP   100 mg at 10/12/19 2126  . vitamin B-12 (CYANOCOBALAMIN) tablet 1,000 mcg  1,000 mcg Oral Daily Charm Rings, NP   1,000 mcg at 10/15/19 4782    Lab Results: No results found for this or any previous visit (from the past  48 hour(s)).  Blood Alcohol level:  Lab Results  Component Value Date   ETH <10 10/10/2019   ETH <10 01/27/2019    Metabolic Disorder Labs: Lab Results  Component Value Date   HGBA1C 5.4 01/15/2018   MPG 108.28 01/15/2018   Lab Results  Component Value Date   PROLACTIN 24.4 (H) 06/08/2016   Lab Results  Component Value Date   CHOL 163 01/15/2018   TRIG 82 01/15/2018   HDL 39 (L) 01/15/2018   CHOLHDL 4.2 01/15/2018   VLDL 16 01/15/2018   LDLCALC 108 (H) 01/15/2018   LDLCALC 93 06/08/2016    Physical Findings: AIMS:  , ,  ,  ,    CIWA:    COWS:     Musculoskeletal: Strength & Muscle Tone: within normal limits Gait & Station: normal Patient leans: N/A  Psychiatric Specialty Exam: Physical Exam  Nursing note and vitals reviewed. Constitutional: She appears well-developed and well-nourished.  HENT:  Head: Normocephalic and atraumatic.  Eyes: Pupils are equal, round, and reactive to light. Conjunctivae are normal.  Neck: Normal range of motion.  Cardiovascular: Regular rhythm and normal heart sounds.  Respiratory: Effort normal. No respiratory distress.  GI: Soft.  Musculoskeletal: Normal range of motion.  Neurological: She is alert.  Skin: Skin is warm and dry.  Psychiatric: Her affect is blunt. Her speech is delayed and tangential. She is slowed. Thought content is paranoid. She expresses inappropriate judgment. She expresses no homicidal and no suicidal ideation. She exhibits abnormal recent memory.    Review of Systems  Constitutional: Negative.   HENT: Negative.   Eyes: Negative.   Respiratory: Negative.   Cardiovascular: Negative.   Gastrointestinal: Negative.   Musculoskeletal: Negative.   Skin: Negative.   Neurological: Negative.   Psychiatric/Behavioral: Positive for hallucinations.    Blood pressure 105/77, pulse 91, temperature 98.5 F (36.9 C), temperature source Oral, resp. rate 16, height 5\' 3"  (1.6 m), weight 59.4 kg, last menstrual period  09/19/2019, SpO2 99 %.Body mass index is 23.21 kg/m.  General Appearance: Casual  Eye Contact:  Good  Speech:  Slow  Volume:  Decreased  Mood:  Dysphoric  Affect:  Constricted  Thought Process:  Disorganized  Orientation:  Full (Time, Place, and Person)  Thought Content:  Illogical, Hallucinations: Auditory and Paranoid Ideation  Suicidal Thoughts:  No  Homicidal Thoughts:  No  Memory:  Immediate;  Fair Recent;   Fair Remote;   Fair  Judgement:  Impaired  Insight:  Lacking  Psychomotor Activity:  Decreased  Concentration:  Concentration: Fair  Recall:  [employer]Fair  Fund of Knowledge:  Fair  Language:  Fair  Akathisia:  No  Handed:  Right  AIMS (if indicated):     Assets:  Desire for Improvement  ADL's:  Impaired  Cognition:  Impaired,  Mild  Sleep:  Number of Hours: 6     Treatment Plan Summary: Daily contact with patient to assess and evaluate symptoms and progress in treatment, Medication management and Plan Patient seems to be doing slightly better.  Still disorganized and paranoid.  Self-care is still somewhat lacking.  No evidence of suicidal violent or dangerous behavior.  Tried to work on Journalist, newspaperbuilding rapport.  No change to medicine for today.  May be ready to discharge sometime later this week.  Mordecai RasmussenJohn Clapacs, MD 10/15/2019, 6:15 PM

## 2019-10-15 NOTE — Progress Notes (Signed)
The patient was observed in the milieu watching TV and talking with her peers. She has been med compliant and pleasant while on the unit. She does seem to be anxious at times. She denies SI/HI AVH. Will continue to monitor and support. She has slept about 8 hours this shift.

## 2019-10-16 NOTE — Progress Notes (Signed)
[facility] MD Progress Note  10/16/2019 12:32 PM Annette Garrison  MRN:  [id number] Subjective: I feel fine today just a little drowsy.  I think I am drowsy from all the medication.  I do want to continue taking the medication I does need to find the right dose, which is why I came to the hospital.  When I leave here, stay on medication and go to [place] for disability.  Objective: 43 year old female who has a previous history for bipolar disorder, cocaine use, marijuana use who presented to the hospital after an argument.  Patient has been appropriate on our unit and has not exhibited any behaviors as noted above.  She has been cooperative with her current treatment plan and our recommendations set forth by the team.  She denies any auditory or visual hallucinations, paranoia, and does not appear to be responding to internal stimuli.  During the interview patient does endorse some dizziness, however writer observed patient stand up quickly with no signs of dizziness, swaying gait, and or lightheadedness.  Patient does report a place to go after discharge as her daughter's home.  She is also seeking disability and plans to resume this process after discharge.  She is compliant with her medication and denies any adverse effects.  Overall patient does appear to be improving since admission to the hospital and should be ready for discharge soon.  Patient was very appropriate with Probation officer and nursing staff and is observed in the milieu being active.    Principal Problem: Schizoaffective disorder, bipolar type ([place]) Diagnosis: Principal Problem:   Schizoaffective disorder, bipolar type ([place]) Active Problems:   Cocaine abuse ([place])  Total Time spent with patient: 30 minutes  Past Psychiatric History: Patient has a history of schizophrenia and noncompliance.  Has had previous hospitalizations several times  Past Medical History:  Past Medical History:  Diagnosis Date  . Anemia   . Bipolar disorder ([place])   .  Hypertension   . Schizophrenia [facility])     Past Surgical History:  Procedure Laterality Date  . CESAREAN SECTION     Family History: History reviewed. No pertinent family history. Family Psychiatric  History: See previous Social History:  Social History   Substance and Sexual Activity  Alcohol Use No     Social History   Substance and Sexual Activity  Drug Use Yes  . Types: Marijuana, Cocaine   Comment: no cocaine "in a long time"; last smoked marijuana a couple of days ago    Social History   Socioeconomic History  . Marital status: Single    Spouse name: Not on file  . Number of children: Not on file  . Years of education: Not on file  . Highest education level: Not on file  Occupational History  . Not on file  Social Needs  . Financial resource strain: Not on file  . Food insecurity    Worry: Not on file    Inability: Not on file  . Transportation needs    Medical: Not on file    Non-medical: Not on file  Tobacco Use  . Smoking status: Current Some Day Smoker    Packs/day: 0.50    Types: Cigarettes  . Smokeless tobacco: Never Used  Substance and Sexual Activity  . Alcohol use: No  . Drug use: Yes    Types: Marijuana, Cocaine    Comment: no cocaine "in a long time"; last smoked marijuana a couple of days ago  . Sexual activity: Not Currently  Lifestyle  .  Physical activity    Days per week: Not on file    Minutes per session: Not on file  . Stress: Not on file  Relationships  . Social Musician on phone: Not on file    Gets together: Not on file    Attends religious service: Not on file    Active member of club or organization: Not on file    Attends meetings of clubs or organizations: Not on file    Relationship status: Not on file  Other Topics Concern  . Not on file  Social History Narrative  . Not on file   Additional Social History:      Sleep: Fair  Appetite:  Fair  Current Medications: Current Facility-Administered  Medications  Medication Dose Route Frequency Provider Last Rate Last Dose  . acetaminophen (TYLENOL) tablet 650 mg  650 mg Oral Q6H PRN Charm Rings, NP   650 mg at 10/16/19 0277  . alum & mag hydroxide-simeth (MAALOX/MYLANTA) 200-200-20 MG/5ML suspension 30 mL  30 mL Oral Q4H PRN Charm Rings, NP      . benztropine (COGENTIN) tablet 0.5 mg  0.5 mg Oral BID Charm Rings, NP   0.5 mg at 10/16/19 0741  . ferrous sulfate tablet 325 mg  325 mg Oral BID WC Charm Rings, NP   325 mg at 10/16/19 0741  . haloperidol (HALDOL) tablet 2 mg  2 mg Oral QHS Charm Rings, NP   2 mg at 10/14/19 2112  . haloperidol (HALDOL) tablet 5 mg  5 mg Oral Blenda Mounts, NP   5 mg at 10/16/19 4128  . magnesium hydroxide (MILK OF MAGNESIA) suspension 30 mL  30 mL Oral Daily PRN Charm Rings, NP      . traZODone (DESYREL) tablet 100 mg  100 mg Oral QHS PRN Charm Rings, NP   100 mg at 10/12/19 2126  . vitamin B-12 (CYANOCOBALAMIN) tablet 1,000 mcg  1,000 mcg Oral Daily Charm Rings, NP   1,000 mcg at 10/16/19 7867    Lab Results: No results found for this or any previous visit (from the past 48 hour(s)).  Blood Alcohol level:  Lab Results  Component Value Date   ETH <10 10/10/2019   ETH <10 01/27/2019    Metabolic Disorder Labs: Lab Results  Component Value Date   HGBA1C 5.4 01/15/2018   MPG 108.28 01/15/2018   Lab Results  Component Value Date   PROLACTIN 24.4 (H) 06/08/2016   Lab Results  Component Value Date   CHOL 163 01/15/2018   TRIG 82 01/15/2018   HDL 39 (L) 01/15/2018   CHOLHDL 4.2 01/15/2018   VLDL 16 01/15/2018   LDLCALC 108 (H) 01/15/2018   LDLCALC 93 06/08/2016    Physical Findings: AIMS:  , ,  ,  ,    CIWA:    COWS:     Musculoskeletal: Strength & Muscle Tone: within normal limits Gait & Station: normal Patient leans: N/A  Psychiatric Specialty Exam: Physical Exam  Nursing note and vitals reviewed. Constitutional: She appears well-developed  and well-nourished.  HENT:  Head: Normocephalic and atraumatic.  Eyes: Pupils are equal, round, and reactive to light. Conjunctivae are normal.  Neck: Normal range of motion.  Cardiovascular: Regular rhythm and normal heart sounds.  Respiratory: Effort normal. No respiratory distress.  GI: Soft.  Musculoskeletal: Normal range of motion.  Neurological: She is alert.  Skin: Skin is warm and dry.  Psychiatric:  Her affect is blunt. Her speech is delayed and tangential. She is slowed. Thought content is paranoid. She expresses inappropriate judgment. She expresses no homicidal and no suicidal ideation. She exhibits abnormal recent memory.    Review of Systems  Constitutional: Negative.   HENT: Negative.   Eyes: Negative.   Respiratory: Negative.   Cardiovascular: Negative.   Gastrointestinal: Negative.   Musculoskeletal: Negative.   Skin: Negative.   Neurological: Negative.   Psychiatric/Behavioral: Positive for hallucinations.    Blood pressure 130/85, pulse 79, temperature 98.8 F (37.1 C), temperature source Oral, resp. rate 18, height 5\' 3"  (1.6 m), weight 59.4 kg, last menstrual period 09/19/2019, SpO2 97 %.Body mass index is 23.21 kg/m.  General Appearance: Fairly Groomed  Eye Contact:  Fair  Speech:  Clear and Coherent and Normal Rate  Volume:  Normal  Mood:  Euthymic  Affect:  Appropriate and Congruent  Thought Process:  Coherent, Linear and Descriptions of Associations: Intact  Orientation:  Full (Time, Place, and Person)  Thought Content:  Logical  Suicidal Thoughts:  Denies  Homicidal Thoughts:  Denies  Memory:  Immediate;   Good Recent;   Good Remote;   Poor  Judgement:  Other:  Improving  Insight:  Shallow  Psychomotor Activity:  Normal  Concentration:  Concentration: Good and Attention Span: Fair  Recall:  Good  Fund of Knowledge:  Good  Language:  Good  Akathisia:  Negative  Handed:  Right  AIMS (if indicated):     Assets:  Communication Skills Desire  for Improvement Financial Resources/Insurance Leisure Time Physical Health Resilience Social Support  ADL's:  Intact  Cognition:  Impaired,  Mild  Sleep:  Number of Hours: 8     Treatment Plan Summary: At this time I have reviewed current treatment plan and have no additional recommendations or changes.  As reported patient appears to be improving overall.  There is no evidence of patient hallucinating, psychosis, and appears to have improved thought processes.  We will continue Haldol 5 mg p.o. every morning, to be accompanied with Cogentin for EPS like symptoms.  We will also continue Haldol 2 mg p.o. nightly.  Daily contact with patient to assess and evaluate symptoms and progress in treatment and Medication management  Maryagnes Amosakia S Starkes-Perry, FNP 10/16/2019, 12:32 PM

## 2019-10-16 NOTE — Progress Notes (Signed)
Recreation Therapy Notes  Date: 10/16/2019  Time: 9:30 am   Location: Craft room   Behavioral response: N/A   Intervention Topic: Strengths   Discussion/Intervention: Patient did not attend group.   Clinical Observations/Feedback:  Patient did not attend group.   Jerrell Hart LRT/CTRS        Chaniyah Jahr 10/16/2019 11:22 AM

## 2019-10-16 NOTE — B[facility] Group Notes (Signed)
Feelings Around Diagnosis 10/16/2019 1PM  Type of Therapy/Topic:  Group Therapy:  Feelings about Diagnosis  Participation Level:  Minimal   Description of Group:   This group will allow patients to explore their thoughts and feelings about diagnoses they have received. Patients will be guided to explore their level of understanding and acceptance of these diagnoses. Facilitator will encourage patients to process their thoughts and feelings about the reactions of others to their diagnosis and will guide patients in identifying ways to discuss their diagnosis with significant others in their lives. This group will be process-oriented, with patients participating in exploration of their own experiences, giving and receiving support, and processing challenge from other group members.   Therapeutic Goals: 1. Patient will demonstrate understanding of diagnosis as evidenced by identifying two or more symptoms of the disorder 2. Patient will be able to express two feelings regarding the diagnosis 3. Patient will demonstrate their ability to communicate their needs through discussion and/or role play  Summary of Patient Progress:  Minimal participation during session. Pt sat quietly, no input provided.      Therapeutic Modalities:   Cognitive Behavioral Therapy Brief Therapy Feelings Identification    Yvette Rack, LCSW 10/16/2019 2:20 PM

## 2019-10-16 NOTE — Progress Notes (Signed)
D: Schizoaffective/ Bipolar  A: Patient stated slept good last night .Stated appetite is good and energy level  Is normal. Stated concentration is good . Stated on Depression scale , hopeless and anxiety .( low 0-10 high) Denies suicidal  homicidal ideations .  No auditory hallucinations  No pain concerns . Appropriate ADL'S. Interacting with peers and staff.   Encourage patient participation with unit programming . Instruction  Given on  Medication , verbalize understanding.Instructed patient on [facility] Education , and unit programing . Patient able to verbalize understanding of information received . Able to verbalize and vent frustrations  Emotional and Mental status improved . Patient  attending unit programing.    No anger  outburst  or safety concerns . Verbalize understanding of information received.  R: Voice no other concerns. Staff continue to monitor

## 2019-10-16 NOTE — Plan of Care (Signed)
Instructed patient on [facility] Education , and unit programing . Patient able to verbalize understanding of information received . Able to verbalize and vent frustrations  Emotional and Mental status improved . Patient  attending unit programing.    No anger  outburst  or safety concerns . Verbalize understanding of information received.  Problem: Coping: Goal: Ability to verbalize frustrations and anger appropriately will improve Outcome: Progressing Goal: Ability to demonstrate self-control will improve Outcome: Progressing   Problem: Safety: Goal: Periods of time without injury will increase Outcome: Progressing   Problem: Education: Goal: Ability to verbalize precipitating factors for violent behavior will improve Outcome: Progressing   Problem: Education: Goal: Knowledge of the prescribed therapeutic regimen will improve Outcome: Progressing   Problem: Education: Goal: Knowledge of [place] [facility] Education information/materials will improve Outcome: Progressing

## 2019-10-17 MED ORDER — BENZTROPINE MESYLATE 0.5 MG PO TABS: 0.5000 mg | ORAL_TABLET | Freq: Two times a day (BID) | ORAL | 0 refills | Status: DC

## 2019-10-17 MED ORDER — TRAZODONE HCL 100 MG PO TABS: 100.0000 mg | ORAL_TABLET | Freq: Every evening | ORAL | 0 refills | Status: DC | PRN

## 2019-10-17 MED ORDER — CYANOCOBALAMIN 1000 MCG PO TABS: 1000.0000 ug | ORAL_TABLET | Freq: Every day | ORAL | 0 refills | Status: DC

## 2019-10-17 MED ORDER — HALOPERIDOL 5 MG PO TABS: 5.0000 mg | ORAL_TABLET | ORAL | 0 refills | Status: DC

## 2019-10-17 MED ORDER — HALOPERIDOL 2 MG PO TABS: 2.0000 mg | ORAL_TABLET | Freq: Every day | ORAL | 0 refills | Status: DC

## 2019-10-17 NOTE — Tx Team (Addendum)
Interdisciplinary Treatment and Diagnostic Plan Update  10/17/2019 Time of Session: 8:30AM Annette Garrison MRN: [id number]  Principal Diagnosis: Schizoaffective disorder, bipolar type [facility])  Secondary Diagnoses: Principal Problem:   Schizoaffective disorder, bipolar type ([place]) Active Problems:   Cocaine abuse ([place])   Current Medications:  Current Facility-Administered Medications  Medication Dose Route Frequency Provider Last Rate Last Dose  . acetaminophen (TYLENOL) tablet 650 mg  650 mg Oral Q6H PRN Patrecia Pour, NP   650 mg at 10/16/19 2149  . alum & mag hydroxide-simeth (MAALOX/MYLANTA) 200-200-20 MG/5ML suspension 30 mL  30 mL Oral Q4H PRN Patrecia Pour, NP      . benztropine (COGENTIN) tablet 0.5 mg  0.5 mg Oral BID Patrecia Pour, NP   0.5 mg at 10/17/19 0758  . ferrous sulfate tablet 325 mg  325 mg Oral BID WC Patrecia Pour, NP   325 mg at 10/17/19 0756  . haloperidol (HALDOL) tablet 2 mg  2 mg Oral QHS Patrecia Pour, NP   2 mg at 10/16/19 2129  . haloperidol (HALDOL) tablet 5 mg  5 mg Oral Aurora Mask, NP   5 mg at 10/17/19 0617  . magnesium hydroxide (MILK OF MAGNESIA) suspension 30 mL  30 mL Oral Daily PRN Patrecia Pour, NP      . traZODone (DESYREL) tablet 100 mg  100 mg Oral QHS PRN Patrecia Pour, NP   100 mg at 10/16/19 2130  . vitamin B-12 (CYANOCOBALAMIN) tablet 1,000 mcg  1,000 mcg Oral Daily Patrecia Pour, NP   1,000 mcg at 10/17/19 4098   PTA Medications: Medications Prior to Admission  Medication Sig Dispense Refill Last Dose  . ferrous sulfate 325 (65 FE) MG tablet Take 1 tablet (325 mg total) by mouth 2 (two) times daily with a meal. (Patient not taking: Reported on 10/10/2019) 60 tablet 1 Not Taking at Unknown time  . [DISCONTINUED] benztropine (COGENTIN) 0.5 MG tablet Take 1 tablet (0.5 mg total) by mouth 2 (two) times daily. (Patient not taking: Reported on 10/10/2019) 60 tablet 1 Not Taking at Unknown time  . [DISCONTINUED]  haloperidol (HALDOL) 2 MG tablet Take 1 tablet (2 mg total) by mouth at bedtime. (Patient not taking: Reported on 10/10/2019) 30 tablet 1 Not Taking at Unknown time  . [DISCONTINUED] haloperidol (HALDOL) 5 MG tablet Take 1 tablet (5 mg total) by mouth every morning. (Patient not taking: Reported on 10/10/2019) 30 tablet 1 Not Taking at Unknown time  . [DISCONTINUED] traZODone (DESYREL) 100 MG tablet Take 1 tablet (100 mg total) by mouth at bedtime as needed for sleep. (Patient not taking: Reported on 10/10/2019) 30 tablet 1 Not Taking at Unknown time  . [DISCONTINUED] vitamin B-12 1000 MCG tablet Take 1 tablet (1,000 mcg total) by mouth daily. (Patient not taking: Reported on 11/17/2016) 30 tablet 3 Not Taking at Unknown time    Patient Stressors: Financial difficulties Marital or family conflict  Patient Strengths: Active sense of humor Capable of independent living Communication skills Supportive family/friends  Treatment Modalities: Medication Management, Group therapy, Case management,  1 to 1 session with clinician, Psychoeducation, Recreational therapy.   Physician Treatment Plan for Primary Diagnosis: Schizoaffective disorder, bipolar type ([place]) Long Term Goal(s): Improvement in symptoms so as ready for discharge Improvement in symptoms so as ready for discharge   Short Term Goals: Ability to identify changes in lifestyle to reduce recurrence of condition will improve Ability to verbalize feelings will improve Ability to disclose  and discuss suicidal ideas Ability to demonstrate self-control will improve Ability to identify and develop effective coping behaviors will improve Ability to maintain clinical measurements within normal limits will improve Compliance with prescribed medications will improve Ability to identify triggers associated with substance abuse/mental health issues will improve Ability to identify changes in lifestyle to reduce recurrence of condition will  improve Ability to verbalize feelings will improve Ability to disclose and discuss suicidal ideas Ability to demonstrate self-control will improve Ability to identify and develop effective coping behaviors will improve Ability to maintain clinical measurements within normal limits will improve Compliance with prescribed medications will improve Ability to identify triggers associated with substance abuse/mental health issues will improve  Medication Management: Evaluate patient's response, side effects, and tolerance of medication regimen.  Therapeutic Interventions: 1 to 1 sessions, Unit Group sessions and Medication administration.  Evaluation of Outcomes: Progressing  Physician Treatment Plan for Secondary Diagnosis: Principal Problem:   Schizoaffective disorder, bipolar type ([facility]) Active Problems:   Cocaine abuse ([facility])  Long Term Goal(s): Improvement in symptoms so as ready for discharge Improvement in symptoms so as ready for discharge   Short Term Goals: Ability to identify changes in lifestyle to reduce recurrence of condition will improve Ability to verbalize feelings will improve Ability to disclose and discuss suicidal ideas Ability to demonstrate self-control will improve Ability to identify and develop effective coping behaviors will improve Ability to maintain clinical measurements within normal limits will improve Compliance with prescribed medications will improve Ability to identify triggers associated with substance abuse/mental health issues will improve Ability to identify changes in lifestyle to reduce recurrence of condition will improve Ability to verbalize feelings will improve Ability to disclose and discuss suicidal ideas Ability to demonstrate self-control will improve Ability to identify and develop effective coping behaviors will improve Ability to maintain clinical measurements within normal limits will improve Compliance with prescribed medications  will improve Ability to identify triggers associated with substance abuse/mental health issues will improve     Medication Management: Evaluate patient's response, side effects, and tolerance of medication regimen.  Therapeutic Interventions: 1 to 1 sessions, Unit Group sessions and Medication administration.  Evaluation of Outcomes: Progressing   RN Treatment Plan for Primary Diagnosis: Schizoaffective disorder, bipolar type ([facility]) Long Term Goal(s): Knowledge of disease and therapeutic regimen to maintain health will improve  Short Term Goals: Ability to demonstrate self-control, Ability to participate in decision making will improve, Ability to verbalize feelings will improve, Ability to disclose and discuss suicidal ideas and Ability to identify and develop effective coping behaviors will improve  Medication Management: RN will administer medications as ordered by provider, will assess and evaluate patient's response and provide education to patient for prescribed medication. RN will report any adverse and/or side effects to prescribing provider.  Therapeutic Interventions: 1 on 1 counseling sessions, Psychoeducation, Medication administration, Evaluate responses to treatment, Monitor vital signs and CBGs as ordered, Perform/monitor CIWA, COWS, AIMS and Fall Risk screenings as ordered, Perform wound care treatments as ordered.  Evaluation of Outcomes: Progressing   LCSW Treatment Plan for Primary Diagnosis: Schizoaffective disorder, bipolar type (H[facility]) Long Term Goal(s): Safe transition to appropriate next level of care at discharge, Engage patient in therapeutic group addressing interpersonal concerns.  Short Term Goals: Engage patient in aftercare planning with referrals and resources, Increase social support, Increase ability to appropriately verbalize feelings, Increase emotional regulation, Facilitate acceptance of mental health diagnosis and concerns and Increase skills for wellness  and recovery  Therapeutic Interventions: Assess for all  discharge needs, 1 to 1 time with Child psychotherapist, Explore available resources and support systems, Assess for adequacy in community support network, Educate family and significant other(s) on suicide prevention, Complete Psychosocial Assessment, Interpersonal group therapy.  Evaluation of Outcomes: Progressing   Progress in Treatment: Attending groups: Yes. Participating in groups: Yes. Taking medication as prescribed: Yes. Toleration medication: Yes. Family/Significant other contact made: No, will contact:  pt declined [facility] contact at this time.  Patient understands diagnosis: Yes. Discussing patient identified problems/goals with staff: Yes. Medical problems stabilized or resolved: Yes. Denies suicidal/homicidal ideation: Yes. Issues/concerns per patient self-inventory: No. Other: none  New problem(s) identified: No, Describe:  none  New Short Term/Long Term Goal(s): elimination of symptoms of psychosis, medication management for mood stabilization; elimination of SI thoughts; development of comprehensive mental wellness plan.  Patient Goals:  "get out and meet my friends"  Discharge Plan or Barriers: Patient reports that she would like a referral to [facility].  She reports plans to return to her home with her grandmother. Update 10/17/19: SPE pamphlet, Mobile Crisis information, and AA/NA information provided to patient for additional community support and resources. Pt is scheduled with [facility] on 12/7 at 7am.  Reason for Continuation of Hospitalization: Medication stabilization  Estimated Length of Stay:  1-2 days  Attendees: Patient:  10/17/2019 2:34 PM  Physician: Dr. Toni Amend 10/17/2019 2:34 PM  Nursing:  10/17/2019 2:34 PM  RN Care Manager: 10/17/2019 2:34 PM  Social Worker: Iris Pert, LCSW 10/17/2019 2:34 PM  Recreational Therapist:  10/17/2019 2:34 PM  Other:  10/17/2019 2:34 PM  Other:  10/17/2019 2:34 PM  Other: 10/17/2019 2:34  PM    Scribe for Treatment Team: Charlann Lange Geza Beranek, LCSW 10/17/2019 2:34 PM

## 2019-10-17 NOTE — Plan of Care (Signed)
Patient able to verbalize understanding of information received . Able to verbalize and vent frustrations  Emotional and Mental status improved . Patient  attending unit programing.    No anger  outburst  or safety concerns . Verbalize understanding of information received.   Problem: Education: Goal: Knowledge of [place] [facility] Education information/materials will improve Outcome: Progressing   Problem: Coping: Goal: Ability to verbalize frustrations and anger appropriately will improve Outcome: Progressing Goal: Ability to demonstrate self-control will improve Outcome: Progressing   Problem: Safety: Goal: Periods of time without injury will increase Outcome: Progressing   Problem: Education: Goal: Ability to verbalize precipitating factors for violent behavior will improve Outcome: Progressing   Problem: Education: Goal: Knowledge of the prescribed therapeutic regimen will improve Outcome: Progressing

## 2019-10-17 NOTE — Progress Notes (Signed)
[facility] MD Progress Note  10/17/2019 1:13 PM Annette Garrison  MRN:  [id number] Subjective: Follow-up for this 43 year old woman with schizophrenia.  Patient seen chart reviewed.  Patient was pleasant and lucid today.  She says that her medicine has helped her to think more clearly.  She feels back to her normal self.  Mood feels more upbeat and positive.  She articulates realistic and positive plans for the future such as letting her mother help her to find a boardinghouse room and possibly looking into getting a job.  She is not showing any side effects of medicine and has not had any behavior problems.  Affect euthymic behavior very appropriate and calm thoughts appear lucid. Principal Problem: Schizoaffective disorder, bipolar type ([place]) Diagnosis: Principal Problem:   Schizoaffective disorder, bipolar type ([place]) Active Problems:   Cocaine abuse ([place])  Total Time spent with patient: 30 minutes  Past Psychiatric History: Past history of schizophrenia recurrent episodes of psychosis  Past Medical History:  Past Medical History:  Diagnosis Date  . Anemia   . Bipolar disorder ([place])   . Hypertension   . Schizophrenia [facility])     Past Surgical History:  Procedure Laterality Date  . CESAREAN SECTION     Family History: History reviewed. No pertinent family history. Family Psychiatric  History: See previous Social History:  Social History   Substance and Sexual Activity  Alcohol Use No     Social History   Substance and Sexual Activity  Drug Use Yes  . Types: Marijuana, Cocaine   Comment: no cocaine "in a long time"; last smoked marijuana a couple of days ago    Social History   Socioeconomic History  . Marital status: Single    Spouse name: Not on file  . Number of children: Not on file  . Years of education: Not on file  . Highest education level: Not on file  Occupational History  . Not on file  Social Needs  . Financial resource strain: Not on file  . Food  insecurity    Worry: Not on file    Inability: Not on file  . Transportation needs    Medical: Not on file    Non-medical: Not on file  Tobacco Use  . Smoking status: Current Some Day Smoker    Packs/day: 0.50    Types: Cigarettes  . Smokeless tobacco: Never Used  Substance and Sexual Activity  . Alcohol use: No  . Drug use: Yes    Types: Marijuana, Cocaine    Comment: no cocaine "in a long time"; last smoked marijuana a couple of days ago  . Sexual activity: Not Currently  Lifestyle  . Physical activity    Days per week: Not on file    Minutes per session: Not on file  . Stress: Not on file  Relationships  . Social Herbalist on phone: Not on file    Gets together: Not on file    Attends religious service: Not on file    Active member of club or organization: Not on file    Attends meetings of clubs or organizations: Not on file    Relationship status: Not on file  Other Topics Concern  . Not on file  Social History Narrative  . Not on file   Additional Social History:                         Sleep: Fair  Appetite:  Fair  Current Medications: Current Facility-Administered Medications  Medication Dose Route Frequency Provider Last Rate Last Dose  . acetaminophen (TYLENOL) tablet 650 mg  650 mg Oral Q6H PRN Charm Rings, NP   650 mg at 10/16/19 2149  . alum & mag hydroxide-simeth (MAALOX/MYLANTA) 200-200-20 MG/5ML suspension 30 mL  30 mL Oral Q4H PRN Charm Rings, NP      . benztropine (COGENTIN) tablet 0.5 mg  0.5 mg Oral BID Charm Rings, NP   0.5 mg at 10/17/19 0758  . ferrous sulfate tablet 325 mg  325 mg Oral BID WC Charm Rings, NP   325 mg at 10/17/19 0756  . haloperidol (HALDOL) tablet 2 mg  2 mg Oral QHS Charm Rings, NP   2 mg at 10/16/19 2129  . haloperidol (HALDOL) tablet 5 mg  5 mg Oral Blenda Mounts, NP   5 mg at 10/17/19 0617  . magnesium hydroxide (MILK OF MAGNESIA) suspension 30 mL  30 mL Oral Daily PRN  Charm Rings, NP      . traZODone (DESYREL) tablet 100 mg  100 mg Oral QHS PRN Charm Rings, NP   100 mg at 10/16/19 2130  . vitamin B-12 (CYANOCOBALAMIN) tablet 1,000 mcg  1,000 mcg Oral Daily Charm Rings, NP   1,000 mcg at 10/17/19 5809    Lab Results: No results found for this or any previous visit (from the past 48 hour(s)).  Blood Alcohol level:  Lab Results  Component Value Date   ETH <10 10/10/2019   ETH <10 01/27/2019    Metabolic Disorder Labs: Lab Results  Component Value Date   HGBA1C 5.4 01/15/2018   MPG 108.28 01/15/2018   Lab Results  Component Value Date   PROLACTIN 24.4 (H) 06/08/2016   Lab Results  Component Value Date   CHOL 163 01/15/2018   TRIG 82 01/15/2018   HDL 39 (L) 01/15/2018   CHOLHDL 4.2 01/15/2018   VLDL 16 01/15/2018   LDLCALC 108 (H) 01/15/2018   LDLCALC 93 06/08/2016    Physical Findings: AIMS:  , ,  ,  ,    CIWA:    COWS:     Musculoskeletal: Strength & Muscle Tone: within normal limits Gait & Station: normal Patient leans: N/A  Psychiatric Specialty Exam: Physical Exam  Nursing note and vitals reviewed. Constitutional: She appears well-developed and well-nourished.  HENT:  Head: Normocephalic and atraumatic.  Eyes: Pupils are equal, round, and reactive to light. Conjunctivae are normal.  Neck: Normal range of motion.  Cardiovascular: Regular rhythm and normal heart sounds.  Respiratory: Effort normal. No respiratory distress.  GI: Soft.  Musculoskeletal: Normal range of motion.  Neurological: She is alert.  Skin: Skin is warm and dry.  Psychiatric: She has a normal mood and affect. Her behavior is normal. Judgment and thought content normal.    Review of Systems  Constitutional: Negative.   HENT: Negative.   Eyes: Negative.   Respiratory: Negative.   Cardiovascular: Negative.   Gastrointestinal: Negative.   Musculoskeletal: Negative.   Skin: Negative.   Neurological: Negative.   Psychiatric/Behavioral:  Negative.     Blood pressure 101/77, pulse 92, temperature 98.7 F (37.1 C), temperature source Oral, resp. rate 17, height 5\' 3"  (1.6 m), weight 59.4 kg, last menstrual period 09/19/2019, SpO2 100 %.Body mass index is 23.21 kg/m.  General Appearance: Casual  Eye Contact:  Good  Speech:  Clear and Coherent  Volume:  Normal  Mood:  Euthymic  Affect:  Congruent  Thought Process:  Coherent  Orientation:  Full (Time, Place, and Person)  Thought Content:  Logical  Suicidal Thoughts:  No  Homicidal Thoughts:  No  Memory:  Immediate;   Fair Recent;   Fair Remote;   Fair  Judgement:  Fair  Insight:  Fair  Psychomotor Activity:  Normal  Concentration:  Concentration: Fair  Recall:  [employer]Fair  Fund of Knowledge:  Fair  Language:  Fair  Akathisia:  No  Handed:  Right  AIMS (if indicated):     Assets:  Desire for Improvement Housing Physical Health  ADL's:  Intact  Cognition:  WNL  Sleep:  Number of Hours: 4.25     Treatment Plan Summary: Daily contact with patient to assess and evaluate symptoms and progress in treatment, Medication management and Plan Patient is doing much better and can be discharged tomorrow.  I will start making arrangements to have a supply of her medicine provided and we will make sure she has referral to appropriate outpatient treatment.  No change to any treatment today.  Mordecai RasmussenJohn Clapacs, MD 10/17/2019, 1:13 PM

## 2019-10-17 NOTE — BHH Group Notes (Signed)
Emotional Regulation 10/17/2019 1PM  Type of Therapy/Topic:  Group Therapy:  Emotion Regulation  Participation Level:  None   Description of Group:   The purpose of this group is to assist patients in learning to regulate negative emotions and experience positive emotions. Patients will be guided to discuss ways in which they have been vulnerable to their negative emotions. These vulnerabilities will be juxtaposed with experiences of positive emotions or situations, and patients will be challenged to use positive emotions to combat negative ones. Special emphasis will be placed on coping with negative emotions in conflict situations, and patients will process healthy conflict resolution skills.  Therapeutic Goals: 1. Patient will identify two positive emotions or experiences to reflect on in order to balance out negative emotions 2. Patient will label two or more emotions that they find the most difficult to experience 3. Patient will demonstrate positive conflict resolution skills through discussion and/or role plays  Summary of Patient Progress:  No participation or input provided. Pt sat quietly during session.     Therapeutic Modalities:   Cognitive Behavioral Therapy Feelings Identification Dialectical Behavioral Therapy   Yvette Rack, LCSW 10/17/2019 2:27 PM

## 2019-10-17 NOTE — Progress Notes (Signed)
D: Affect  pleasant on approach. Interacting  with  Peers and staff Patient stated slept fair last night .Stated appetite  good and energy level  normal. Stated concentration  good . Stated on Depression scale 1 , hopeless 0 and anxiety 0 .( low 0-10 high) Denies suicidal  homicidal ideations  . Denies auditory hallucinations   . Appropriate ADL'S. Interacting with peers and staff.  Patient continue to have her period. Stated her flow has been very heavy.  Voice of having a positive attitude. Stated she was ready to go home .Marland Kitchen Patient able to verbalize understanding of information received . Able to verbalize and vent frustrations  Emotional and Mental status improved . Patient attending unit programing. No anger outburst or safety concerns . Verbalize understanding of information received.  A: Encourage patient participation with unit programming . Instruction  Given on  Medication , verbalize understanding.  R: Voice no other concerns. Staff continue to monitor

## 2019-10-17 NOTE — Progress Notes (Signed)
Recreation Therapy Notes  Date: 10/17/2019  Time: 9:30 am   Location: Craft room   Behavioral response: N/A   Intervention Topic: Coping skills   Discussion/Intervention: Patient did not attend group.   Clinical Observations/Feedback:  Patient did not attend group.   Brenlyn Beshara LRT/CTRS        Bryant Lipps 10/17/2019 10:34 AM

## 2019-10-17 NOTE — Plan of Care (Signed)
Pt visible in the milieu socializing. Pt denies SI/HI/AVH. Pt received her hs medications, Prn tylenol given for headache with good effect. Q 15 minute safety checks maintained.  Problem: Education: Goal: Knowledge of [place] General Education information/materials will improve Outcome: Progressing   Problem: Coping: Goal: Ability to verbalize frustrations and anger appropriately will improve Outcome: Progressing Goal: Ability to demonstrate self-control will improve Outcome: Progressing   Problem: Safety: Goal: Periods of time without injury will increase Outcome: Progressing   Problem: Education: Goal: Ability to verbalize precipitating factors for violent behavior will improve Outcome: Progressing   Problem: Education: Goal: Knowledge of the prescribed therapeutic regimen will improve Outcome: Progressing

## 2019-10-18 MED ORDER — HALOPERIDOL 5 MG PO TABS: 5.0000 mg | ORAL_TABLET | ORAL | 1 refills | Status: DC

## 2019-10-18 MED ORDER — BENZTROPINE MESYLATE 0.5 MG PO TABS: 0.5000 mg | ORAL_TABLET | Freq: Two times a day (BID) | ORAL | 1 refills | Status: DC

## 2019-10-18 MED ORDER — CYANOCOBALAMIN 1000 MCG PO TABS: 1000.0000 ug | ORAL_TABLET | Freq: Every day | ORAL | 1 refills | Status: DC

## 2019-10-18 MED ORDER — HALOPERIDOL 2 MG PO TABS: 2.0000 mg | ORAL_TABLET | Freq: Every day | ORAL | 1 refills | Status: DC

## 2019-10-18 MED ORDER — TRAZODONE HCL 100 MG PO TABS: 100.0000 mg | ORAL_TABLET | Freq: Every evening | ORAL | 1 refills | Status: DC | PRN

## 2019-10-18 NOTE — Plan of Care (Signed)
  Problem: Group Participation Goal: STG - Patient will engage in groups without prompting or encouragement from LRT x3 group sessions within 5 recreation therapy group sessions Description: STG - Patient will engage in groups without prompting or encouragement from LRT x3 group sessions within 5 recreation therapy group sessions 10/18/2019 1201 by Ernest Haber, LRT Outcome: Adequate for Discharge 10/18/2019 1201 by Ernest Haber, LRT Outcome: Adequate for Discharge

## 2019-10-18 NOTE — Progress Notes (Signed)
  [facility] Adult Case Management Discharge Plan :  Will you be returning to the same living situation after discharge:  Yes,  lives with relative At discharge, do you have transportation home?: Yes,  pt reports mother will pick up Do you have the ability to pay for your medications: No.  Release of information consent forms completed and in the chart;  Patient's signature needed at discharge.  Patient to Follow up at: Follow-up Information    [facility] Follow up on 10/22/2019.   Why: You have a zoom meeting scheduled with Sherrian Divers on 10/22/19 at [place]. Thank You! Contact information: [place] [address] [phone]           Next level of care provider has access to [place] and Suicide Prevention discussed: Yes,  with pt; declined family contact  Have you used any form of tobacco in the last 30 days? (Cigarettes, Smokeless Tobacco, Cigars, and/or Pipes): Yes  Has patient been referred to the Quitline?: Patient refused referral  Patient has been referred for addiction treatment: Pt. refused referral  Yvette Rack, LCSW 10/18/2019, 9:25 AM

## 2019-10-18 NOTE — Progress Notes (Signed)
Recreation Therapy Notes  Date: 10/18/2019  Time: 9:30 am  Location: Craft room  Behavioral response: Appropriate   Intervention Topic: Leisure   Discussion/Intervention:  Group content today was focused on leisure. The group defined what leisure is and some positive leisure activities they participate in. Individuals identified the difference between good and bad leisure. Participants expressed how they feel after participating in the leisure of their choice. The group discussed how they go about picking a leisure activity and if others are involved in their leisure activities. The patient stated how many leisure activities they too choose from and reasons why it is important to have leisure time. Individuals participated in the intervention "Leisure Jeopardy" where they had a chance to identify new leisure activities as well as benefits of leisure. Clinical Observations/Feedback:  Patient came to group late and expressed that she likes to spend her leisure time singing, playing cards and listening to music. Individual was social with peers and staff while participating in group.  Noemi Ishmael LRT/CTRS         Syna Gad 10/18/2019 11:26 AM

## 2019-10-18 NOTE — BHH Suicide Risk Assessment (Signed)
[facility] Discharge Suicide Risk Assessment   Principal Problem: Schizoaffective disorder, bipolar type [facility]) Discharge Diagnoses: Principal Problem:   Schizoaffective disorder, bipolar type ([place]) Active Problems:   Cocaine abuse ([place])   Total Time spent with patient: 30 minutes  Musculoskeletal: Strength & Muscle Tone: within normal limits Gait & Station: normal Patient leans: N/A  Psychiatric Specialty Exam: Review of Systems  Constitutional: Negative.   HENT: Negative.   Eyes: Negative.   Respiratory: Negative.   Cardiovascular: Negative.   Gastrointestinal: Negative.   Musculoskeletal: Negative.   Skin: Negative.   Neurological: Negative.   Psychiatric/Behavioral: Negative.     Blood pressure 97/69, pulse 93, temperature 98.6 F (37 C), temperature source Oral, resp. rate 17, height 5\' 3"  (1.6 m), weight 59.4 kg, last menstrual period 09/19/2019, SpO2 98 %.Body mass index is 23.21 kg/m.  General Appearance: Casual  Eye Contact::  Good  Speech:  Clear and [id number]  Volume:  Normal  Mood:  Euthymic  Affect:  Congruent  Thought Process:  Goal Directed  Orientation:  Full (Time, Place, and Person)  Thought Content:  Logical  Suicidal Thoughts:  No  Homicidal Thoughts:  No  Memory:  Immediate;   Fair Recent;   Fair Remote;   Fair  Judgement:  Fair  Insight:  Fair  Psychomotor Activity:  Normal  Concentration:  Fair  Recall:  [employer] of Knowledge:Fair  Language: Fair  Akathisia:  No  Handed:  Right  AIMS (if indicated):     Assets:  Desire for Improvement Physical Health Resilience  Sleep:  Number of Hours: 8  Cognition: WNL  ADL's:  Intact   Mental Status Per Nursing Assessment::   On Admission:  NA  Demographic Factors:  Low socioeconomic status  Loss Factors: Financial problems/change in socioeconomic status  Historical Factors: Impulsivity  Risk Reduction Factors:   Sense of responsibility to family, Religious beliefs about death,  Positive social support and Positive therapeutic relationship  Continued Clinical Symptoms:  Schizophrenia:   Paranoid or undifferentiated type  Cognitive Features That Contribute To Risk:  None    Suicide Risk:  Minimal: No identifiable suicidal ideation.  Patients presenting with no risk factors but with morbid ruminations; may be classified as minimal risk based on the severity of the depressive symptoms  Follow-up Information    [place] [place] Follow up on 10/22/2019.   Why: You have a zoom meeting scheduled with Sherrian Divers on 10/22/19 at [place]. Thank You! Contact information: [place] [address] [phone]           Plan Of Care/Follow-up recommendations:  Activity:  Activity as tolerated Diet:  Regular diet Other:  Follow-up with outpatient treatment through [place], MD 10/18/2019, 10:25 AM

## 2019-10-18 NOTE — Plan of Care (Signed)
  Problem: Education: Goal: Knowledge of the prescribed therapeutic regimen will improve 10/18/2019 0510 by Harl Bowie, RN Outcome: Progressing 10/18/2019 0509 by Harl Bowie, RN Outcome: Progressing  Patient complaint with prescribed medication regimen.

## 2019-10-18 NOTE — Progress Notes (Signed)
Recreation Therapy Notes  INPATIENT RECREATION TR PLAN  Patient Details Name: Annette Garrison MRN: [id number] DOB: 1976-10-02 Today's Date: 10/18/2019  Rec Therapy Plan Is patient appropriate for Therapeutic Recreation?: Yes Treatment times per week: at least 3 Estimated Length of Stay: 5-7 days TR Treatment/Interventions: Group participation (Comment)  Discharge Criteria Pt will be discharged from therapy if:: Discharged Treatment plan/goals/alternatives discussed and agreed upon by:: Patient/family  Discharge Summary Short term goals set: Patient will engage in groups without prompting or encouragement from LRT x3 group sessions within 5 recreation therapy group sessions Short term goals met: Adequate for discharge Progress toward goals comments: Groups attended Which groups?: Stress management, Leisure education Reason goals not met: N/A Therapeutic equipment acquired: N/A Reason patient discharged from therapy: Discharge from hospital Pt/family agrees with progress & goals achieved: Yes Date patient discharged from therapy: 10/18/19   Cyleigh Massaro 10/18/2019, 12:02 PM

## 2019-10-18 NOTE — Progress Notes (Signed)
D: Patient is aware of  Discharge this shift   A.Patient denies suicidal /homicidal ideations. Patient received all belongings brought in   No Storage medications. Writer reviewed Discharge Summary, Suicide Risk Assessment, and Transitional Record. Patient also received Prescriptions   from  MD. A 7 day supply of medications given to patient . Informed of 7 day supply  Of medication  . Aware  Of follow up appointment . A;dffect cheerful on approach. Appetite good , voice no other concerns   R: Patient left unit with no questions  Or concerns  With family.

## 2019-10-18 NOTE — Plan of Care (Signed)
  Problem: Coping: Goal: Ability to verbalize frustrations and anger appropriately will improve Outcome: Adequate for Discharge   Problem: Coping: Goal: Ability to verbalize frustrations and anger appropriately will improve Outcome: Adequate for Discharge Goal: Ability to demonstrate self-control will improve Outcome: Adequate for Discharge   Problem: Education: Goal: Ability to verbalize precipitating factors for violent behavior will improve Outcome: Adequate for Discharge   Problem: Education: Goal: Knowledge of the prescribed therapeutic regimen will improve Outcome: Adequate for Discharge

## 2019-10-18 NOTE — Progress Notes (Signed)
Patient alert and oriented x 4, affect is flat, but she brightens upon approach,she is interacting appropriately with peers and staff, her thoughts are organized and coherent, she denies SI/HI/AVH no distress noted, she denies pain and discomfort currently, 15 minutes safety checks maintained will continue to monitor.

## 2019-10-18 NOTE — Discharge Summary (Signed)
Physician Discharge Summary Note  Patient:  Annette Garrison is an 43 y.o., female MRN:  [id number] DOB:  Mar 18, 1976 Patient phone:  [phone] (home)  Patient address:   [address] [place] [address],  Total Time spent with patient: 30 minutes  Date of Admission:  10/11/2019 Date of Discharge: 10/18/19  Reason for Admission:  43 year old female with a reported past psychiatric history significant for bipolar disorder, cocaine use disorder, marijuana use disorder who presented to the [facility] emergency department under involuntary commitment. She is not a good historian, and most of the background is collected from the electronic medical record. The patient did state that she got into an argument last night and that is the reason why she was admitted. Review of the emergency room notes showed that the patient had been smoking marijuana, but denied any other drug use.   Principal Problem: Schizoaffective disorder, bipolar type [facility]) Discharge Diagnoses: Principal Problem:   Schizoaffective disorder, bipolar type (HCC) Active Problems:   Cocaine abuse [facility])   Past Psychiatric History: Patient has 2 previous psychiatric admissions at our facility.  1 in 2017 and the most recent in March 2019.  She has been sent for substance abuse treatment, but I do not think ever followed up with that.  She was referred to the [facility] clinic somewhere in [facility] where she did outpatient subs abuse treatment by report.  Past Medical History:  Past Medical History:  Diagnosis Date  . Anemia   . Bipolar disorder (HCC)   . Hypertension   . Schizophrenia [facility])     Past Surgical History:  Procedure Laterality Date  . CESAREAN SECTION     Family History: History reviewed. No pertinent family history. Family Psychiatric  History: Denies Social History:  Social History   Substance and Sexual Activity  Alcohol Use No     Social History   Substance and  Sexual Activity  Drug Use Yes  . Types: Marijuana, Cocaine   Comment: no cocaine "in a long time"; last smoked marijuana a couple of days ago    Social History   Socioeconomic History  . Marital status: Single    Spouse name: Not on file  . Number of children: Not on file  . Years of education: Not on file  . Highest education level: Not on file  Occupational History  . Not on file  Social Needs  . Financial resource strain: Not on file  . Food insecurity    Worry: Not on file    Inability: Not on file  . Transportation needs    Medical: Not on file    Non-medical: Not on file  Tobacco Use  . Smoking status: Current Some Day Smoker    Packs/day: 0.50    Types: Cigarettes  . Smokeless tobacco: Never Used  Substance and Sexual Activity  . Alcohol use: No  . Drug use: Yes    Types: Marijuana, Cocaine    Comment: no cocaine "in a long time"; last smoked marijuana a couple of days ago  . Sexual activity: Not Currently  Lifestyle  . Physical activity    Days per week: Not on file    Minutes per session: Not on file  . Stress: Not on file  Relationships  . Social Musician on phone: Not on file    Gets together: Not on file    Attends religious service: Not on file    Active member of club  or organization: Not on file    Attends meetings of clubs or organizations: Not on file    Relationship status: Not on file  Other Topics Concern  . Not on file  Social History Narrative  . Not on file    Hospital Course:  Patient remained on the [facility] unit for 6 days. The patient stabilized on medication and therapy. Patient was discharged on Cogentin 0.5 mg BID, Haldol 2 mg QHS and 3 mg QAM, and Trazodone 100 mg QHS PRN. Patient has shown improvement with improved mood, affect, sleep, appetite, and interaction. Patient has attended group and participated. Patient has been seen in the day room interacting with peers and staff appropriately. Patient denies any SI/HI/AVH and  contracts for safety. Patient agrees to follow up at [facility] services. Patient is provided with prescriptions for their medications upon discharge.  Physical Findings: AIMS:  , ,  ,  ,    CIWA:    COWS:     Musculoskeletal: Strength & Muscle Tone: within normal limits Gait & Station: normal Patient leans: N/A  Psychiatric Specialty Exam: Physical Exam  Nursing note and vitals reviewed. Constitutional: She is oriented to person, place, and time. She appears well-developed and well-nourished.  Cardiovascular: Normal rate.  Respiratory: Effort normal.  Musculoskeletal: Normal range of motion.  Neurological: She is alert and oriented to person, place, and time.  Skin: Skin is warm.    Review of Systems  Constitutional: Negative.   HENT: Negative.   Eyes: Negative.   Respiratory: Negative.   Cardiovascular: Negative.   Gastrointestinal: Negative.   Genitourinary: Negative.   Musculoskeletal: Negative.   Skin: Negative.   Neurological: Negative.   Endo/Heme/Allergies: Negative.   Psychiatric/Behavioral: Negative.     Blood pressure 97/69, pulse 93, temperature 98.6 F (37 C), temperature source Oral, resp. rate 17, height 5\' 3"  (1.6 m), weight 59.4 kg, last menstrual period 09/19/2019, SpO2 98 %.Body mass index is 23.21 kg/m.   General Appearance: Casual  Eye Contact::  Good  Speech:  Clear and [id number]  Volume:  Normal  Mood:  Euthymic  Affect:  Congruent  Thought Process:  Goal Directed  Orientation:  Full (Time, Place, and Person)  Thought Content:  Logical  Suicidal Thoughts:  No  Homicidal Thoughts:  No  Memory:  Immediate;   Fair Recent;   Fair Remote;   Fair  Judgement:  Fair  Insight:  Fair  Psychomotor Activity:  Normal  Concentration:  Fair  Recall:  [place]  Language: Fair  Akathisia:  No  Handed:  Right  AIMS (if indicated):     Assets:  Desire for Improvement Physical Health Resilience  Sleep:  Number of Hours: 8   Cognition: WNL  ADL's:  Intact   Have you used any form of tobacco in the last 30 days? (Cigarettes, Smokeless Tobacco, Cigars, and/or Pipes): Yes  Has this patient used any form of tobacco in the last 30 days? (Cigarettes, Smokeless Tobacco, Cigars, and/or Pipes) Yes, Yes, A prescription for an FDA-approved tobacco cessation medication was offered at discharge and the patient refused  Blood Alcohol level:  Lab Results  Component Value Date   [facility] <10 10/10/2019   ETH <10 85/12/7739    Metabolic Disorder Labs:  Lab Results  Component Value Date   HGBA1C 5.4 01/15/2018   MPG 108.28 01/15/2018   Lab Results  Component Value Date   PROLACTIN 24.4 (H) 06/08/2016   Lab Results  Component Value Date  CHOL 163 01/15/2018   TRIG 82 01/15/2018   HDL 39 (L) 01/15/2018   CHOLHDL 4.2 01/15/2018   VLDL 16 01/15/2018   LDLCALC 108 (H) 01/15/2018   LDLCALC 93 06/08/2016    See Psychiatric Specialty Exam and Suicide Risk Assessment completed by Attending Physician prior to discharge.  Discharge destination:  Home  Is patient on multiple antipsychotic therapies at discharge:  No   Has Patient had three or more failed trials of antipsychotic monotherapy by history:  No  Recommended Plan for Multiple Antipsychotic Therapies: NA  Discharge Instructions    Diet - low sodium heart healthy   Complete by: As directed    Increase activity slowly   Complete by: As directed      Allergies as of 10/18/2019   No Known Allergies     Medication List    STOP taking these medications   ferrous sulfate 325 (65 FE) MG tablet     TAKE these medications     Indication  benztropine 0.5 MG tablet Commonly known as: COGENTIN Take 1 tablet (0.5 mg total) by mouth 2 (two) times daily.  Indication: Extrapyramidal Reaction caused by Medications   cyanocobalamin 1000 MCG tablet Take 1 tablet (1,000 mcg total) by mouth daily.  Indication: Inadequate Vitamin B12   haloperidol 5 MG  tablet Commonly known as: HALDOL Take 1 tablet (5 mg total) by mouth every morning.  Indication: Psychosis   haloperidol 2 MG tablet Commonly known as: HALDOL Take 1 tablet (2 mg total) by mouth at bedtime.  Indication: Psychosis   traZODone 100 MG tablet Commonly known as: DESYREL Take 1 tablet (100 mg total) by mouth at bedtime as needed for sleep.  Indication: Trouble Sleeping      Follow-up Information    [facility] Follow up on 10/22/2019.   Why: You have a zoom meeting scheduled with Unk PintoHarvey Bryant on 10/22/19 at 7AM. Thank You! Contact information: [address] [place] [address] [phone]           Follow-up recommendations:  Continue activity as tolerated. Continue diet as recommended by your PCP. Ensure to keep all appointments with outpatient providers.  Comments:  Patient is instructed prior to discharge to: Take all medications as prescribed by his/her mental healthcare provider. Report any adverse effects and or reactions from the medicines to his/her outpatient provider promptly. Patient has been instructed & cautioned: To not engage in alcohol and or illegal drug use while on prescription medicines. In the event of worsening symptoms, patient is instructed to call the crisis hotline, 911 and or go to the nearest ED for appropriate evaluation and treatment of symptoms. To follow-up with his/her primary care provider for your other medical issues, concerns and or health care needs.    Signed: Gerlene Burdockravis B Neeya Prigmore, FNP 10/18/2019, 10:17 AM

## 2019-11-02 ENCOUNTER — Ambulatory Visit: Payer: Medicaid Other | Admitting: Pharmacy Technician

## 2019-11-02 ENCOUNTER — Other Ambulatory Visit: Payer: Self-pay

## 2019-11-02 DIAGNOSIS — Z79899 Other long term (current) drug therapy: Secondary | ICD-10-CM

## 2019-11-02 NOTE — Progress Notes (Signed)
Completed [facility] application and contract.  Patient agreed to all terms of the [facility] contract.    Patient approved to receive medication assistance as long as eligibility criteria continues to be met.    Provided patient with community resource material based on her particular needs.    Referred patient to [facility] and [facility].  [place] [facility]

## 2019-11-19 ENCOUNTER — Other Ambulatory Visit: Payer: Medicaid Other

## 2019-12-05 ENCOUNTER — Other Ambulatory Visit: Payer: Self-pay

## 2019-12-05 ENCOUNTER — Ambulatory Visit: Payer: Medicaid Other

## 2019-12-05 DIAGNOSIS — Z79899 Other long term (current) drug therapy: Secondary | ICD-10-CM

## 2019-12-05 NOTE — Progress Notes (Signed)
  [facility] Visit Note  Patient: Annette Garrison MRN: [id number] Date of Birth: [birth year] [facility]   Annette Garrison 44 y.o. female presents for a medication therapy management visit today. Patient was seen after she presented in-person to pick up medications.  There were no vitals taken for this visit.  Patient Information   Past Medical History:  Diagnosis Date  . Anemia   . Bipolar disorder (HCC)   . Hypertension   . Schizophrenia [facility])       Past Surgical History:  Procedure Laterality Date  . CESAREAN SECTION      No family history on file.    Social History   Substance and Sexual Activity  Alcohol Use No    Social History   Tobacco Use  Smoking Status Current Some Day Smoker  . Packs/day: 0.50  . Types: Cigarettes  Smokeless Tobacco Never Used   Health Maintenance  Topic Date Due  . HIV Screening  08/03/1989  . TETANUS/TDAP  08/03/1993  . PAP SMEAR-Modifier  08/04/1995  . INFLUENZA VACCINE  Completed   Outpatient Encounter Medications as of 12/05/2019  Medication Sig  . benztropine (COGENTIN) 0.5 MG tablet Take 1 tablet (0.5 mg total) by mouth 2 (two) times daily.  . haloperidol (HALDOL) 2 MG tablet Take 1 tablet (2 mg total) by mouth at bedtime.  . haloperidol (HALDOL) 5 MG tablet Take 1 tablet (5 mg total) by mouth every morning.  . traZODone (DESYREL) 100 MG tablet Take 1 tablet (100 mg total) by mouth at bedtime as needed for sleep.  . cyanocobalamin 1000 MCG tablet Take 1 tablet (1,000 mcg total) by mouth daily.   No facility-administered encounter medications on file as of 12/05/2019.   Health Maintenance  Last ED visit: Admitted 10/11/2019-10/18/2019 at [facility] for IVC Last Visit to PCP: Unk. Next Visit to PCP: Unk. Dental Exam: Unk. Eye Exam: Unk. Pelvic/PAP Exam: Unk. Mammogram: Unk. DEXA: <65 y/o Colonoscopy: Unk. Flu Vaccine: Unk. Pneumonia Vaccine: n/a COVID-19 Vaccine:  Unk. Shingrix Vaccine: <50 y/o  Assessment and Plan:  1. Schizoaffective disorder, bipolar type -Haloperidol 5 mg QAM, haloperidol 2 mg QPM, benztropine 0.5 mg BID -Denies adverse effects with these medications  2. Insomnia -Trazodone 100 mg qHS PRN sleep but patient is taking this every night -Reports adverse effects of this medication including next day drowsiness, headache, heart racing -Counseled patient that this medication is only to be taken if she is having difficulty sleeping. Advised patient to try taking half of a tablet to avoid side effects and follow-up with provider to address need for change to prescription dosage  3. Medication adherence -Endorses taking medications as prescribed -Presented with medication bottles. Still had supply of trazodone and both strengths of haloperidol. Completely out of benztropine.  -Benztropine refilled  RTC 1 year  Dorothea Ogle Pharmacy Resident 05 December 2019

## 2020-03-05 ENCOUNTER — Other Ambulatory Visit: Payer: Self-pay | Admitting: Psychiatry

## 2020-03-06 ENCOUNTER — Telehealth: Payer: Self-pay | Admitting: Pharmacy Technician

## 2020-03-06 NOTE — Telephone Encounter (Signed)
Received updated proof of income.  Patient eligible to receive medication assistance at [facility] until time for re-certification in 9359, and as long as eligibility requirements continue to be met.  [place] [facility]

## 2020-12-08 ENCOUNTER — Other Ambulatory Visit: Payer: Medicaid Other

## 2020-12-08 NOTE — Progress Notes (Deleted)
  [facility] Visit Note  Patient: Annette Garrison MRN: [id number] Date of Birth: [birth year] [facility]   Marguerite Lestine Box 45 y.o. female presents for a telephone visit for medication management today. Verified patient with two identifiers.   There were no vitals taken for this visit.  Patient Information   Past Medical History:  Diagnosis Date  . Anemia   . Bipolar disorder (HCC)   . Hypertension   . Schizophrenia [facility])       Past Surgical History:  Procedure Laterality Date  . CESAREAN SECTION      No family history on file.  New Diagnoses (since last visit):   Family Support: {Family Support:[id number]}  Lifestyle Diet: Breakfast:*** Lunch:*** Dinner:*** Drinks:***            Social History   Substance and Sexual Activity  Alcohol Use No      Social History   Tobacco Use  Smoking Status Current Some Day Smoker  . Packs/day: 0.50  . Types: Cigarettes  Smokeless Tobacco Never Used      Health Maintenance  Topic Date Due  . Hepatitis C Screening  Never done  . COVID-19 Vaccine (1) Never done  . HIV Screening  Never done  . TETANUS/TDAP  Never done  . PAP SMEAR-Modifier  Never done  . COLONOSCOPY (Pts 45-67yrs Insurance coverage will need to be confirmed)  Never done  . INFLUENZA VACCINE  06/15/2020   Health Maintenance/Date Completed  Last ED visit: none recent Last Visit to PCP: *** Next Visit to PCP: *** Specialist Visit: *** Dental Exam: *** Eye Exam: *** Pelvic/PAP Exam: *** Mammogram: *** Colonoscopy: *** Flu Vaccine: *** Pneumonia Vaccine: *** COVID-19 Vaccine: ***   Assessment and Plan: Schizoaffective disorder, bipolar    Insomnia   Access/Adherence    Raiford Noble, PharmD Pharmacy Resident  12/08/2020 8:31 AM

## 2021-01-18 ENCOUNTER — Other Ambulatory Visit: Payer: Self-pay

## 2021-01-18 ENCOUNTER — Encounter: Payer: Self-pay | Admitting: Intensive Care

## 2021-01-18 ENCOUNTER — Emergency Department
Admission: EM | Admit: 2021-01-18 | Discharge: 2021-01-18 | Disposition: A | Discharge status: Home / Self Care | Payer: Medicaid Other | Attending: Emergency Medicine | Admitting: Emergency Medicine

## 2021-01-18 ENCOUNTER — Other Ambulatory Visit: Payer: Self-pay | Admitting: Emergency Medicine

## 2021-01-18 DIAGNOSIS — I10 Essential (primary) hypertension: Secondary | ICD-10-CM | POA: Insufficient documentation

## 2021-01-18 DIAGNOSIS — Z760 Encounter for issue of repeat prescription: Secondary | ICD-10-CM | POA: Insufficient documentation

## 2021-01-18 DIAGNOSIS — F17210 Nicotine dependence, cigarettes, uncomplicated: Secondary | ICD-10-CM | POA: Insufficient documentation

## 2021-01-18 LAB — URINALYSIS, COMPLETE (UACMP) WITH MICROSCOPIC
Bacteria, UA: NONE SEEN
Bilirubin Urine: NEGATIVE
Glucose, UA: NEGATIVE mg/dL
Hgb urine dipstick: NEGATIVE
Ketones, ur: NEGATIVE mg/dL
Leukocytes,Ua: NEGATIVE
Nitrite: NEGATIVE
Protein, ur: NEGATIVE mg/dL
Specific Gravity, Urine: 1.02 (ref 1.005–1.030)
pH: 5 (ref 5.0–8.0)

## 2021-01-18 LAB — POC URINE PREG, ED: Preg Test, Ur: NEGATIVE

## 2021-01-18 MED ORDER — CYANOCOBALAMIN 1000 MCG PO TABS: 1000.0000 ug | ORAL_TABLET | Freq: Every day | ORAL | 1 refills | Status: DC

## 2021-01-18 MED ORDER — HALOPERIDOL 2 MG PO TABS: 2.0000 mg | ORAL_TABLET | Freq: Every day | ORAL | 1 refills | Status: DC

## 2021-01-18 MED ORDER — BENZTROPINE MESYLATE 0.5 MG PO TABS: 0.5000 mg | ORAL_TABLET | Freq: Two times a day (BID) | ORAL | 1 refills | Status: DC

## 2021-01-18 MED ORDER — TRAZODONE HCL 100 MG PO TABS
100.0000 mg | ORAL_TABLET | Freq: Every evening | ORAL | 1 refills | Status: DC | PRN
Start: 2021-01-18 — End: 2021-01-18

## 2021-01-18 MED ORDER — HALOPERIDOL 5 MG PO TABS: 5.0000 mg | ORAL_TABLET | ORAL | 1 refills | Status: DC

## 2021-01-18 NOTE — ED Triage Notes (Addendum)
Patient reports she is here for psych medication refill. Also requesting pregnancy test

## 2021-01-18 NOTE — ED Provider Notes (Signed)
[facility] Emergency Department Provider Note   ____________________________________________    I have reviewed the triage vital signs and the nursing notes.   HISTORY  Chief Complaint Medication Refill     HPI Annette Garrison is a 45 y.o. female is here for refill of her medications, she reports she needs a refill otherwise she will run out this weekend.  She has no complaints.  She does request a pregnancy test because she is concerned she may be pregnant.  Past Medical History:  Diagnosis Date  . Anemia   . Bipolar disorder (HCC)   . Hypertension   . Schizophrenia [facility])     Patient Active Problem List   Diagnosis Date Noted  . Schizoaffective disorder, bipolar type (HCC) 10/11/2019  . Cannabis abuse   . Vitamin B12 deficiency 06/08/2016  . Cocaine use disorder, moderate, dependence (HCC) 06/07/2016  . Tobacco use disorder 06/07/2016  . Anemia 06/07/2016  . Cocaine abuse (HCC) 08/04/2015  . Dysmenorrhea 08/04/2015  . Hypertension 08/04/2015    Past Surgical History:  Procedure Laterality Date  . CESAREAN SECTION      Prior to Admission medications   Medication Sig Start Date End Date Taking? Authorizing Provider  benztropine (COGENTIN) 0.5 MG tablet Take 1 tablet (0.5 mg total) by mouth 2 (two) times daily. 01/18/21   Jene Every, MD  cyanocobalamin 1000 MCG tablet Take 1 tablet (1,000 mcg total) by mouth daily. 01/18/21   Jene Every, MD  haloperidol (HALDOL) 2 MG tablet Take 1 tablet (2 mg total) by mouth at bedtime. 01/18/21   Jene Every, MD  haloperidol (HALDOL) 5 MG tablet Take 1 tablet (5 mg total) by mouth every morning. 01/18/21   Jene Every, MD  traZODone (DESYREL) 100 MG tablet Take 1 tablet (100 mg total) by mouth at bedtime as needed for sleep. 01/18/21   Jene Every, MD     Allergies Patient has no known allergies.  History reviewed. No pertinent family history.  Social History Social History    Tobacco Use  . Smoking status: Current Some Day Smoker    Packs/day: 0.50    Types: Cigarettes  . Smokeless tobacco: Never Used  Vaping Use  . Vaping Use: Never used  Substance Use Topics  . Alcohol use: Yes  . Drug use: Yes    Types: Marijuana, Cocaine    Comment: no cocaine "in a long time"; last smoked marijuana a couple of days ago    Review of Systems     Gastrointestinal: No abdominal pain.  No nausea, no vomiting.   Genitourinary: Negative for dysuria.  No vaginal bleeding   Neurological: Negative for headaches     ____________________________________________   PHYSICAL EXAM:  VITAL SIGNS: ED Triage Vitals  Enc Vitals Group     BP 01/18/21 1620 (!) 138/99     Pulse Rate 01/18/21 1620 100     Resp 01/18/21 1620 16     Temp 01/18/21 1620 98.8 F (37.1 C)     Temp Source 01/18/21 1620 Oral     SpO2 01/18/21 1620 100 %     Weight 01/18/21 1621 55.7 kg (122 lb 11.2 oz)     Height 01/18/21 1621 1.6 m (5\' 3" )     Head Circumference --      Peak Flow --      Pain Score 01/18/21 1620 0     Pain Loc --      Pain Edu? --  Excl. in GC? --     Constitutional: Alert and oriented. No acute distress. Pleasant and interactive Eyes: Conjunctivae are normal.  Head: Atraumatic. Nose: No congestion/rhinnorhea. Mouth/Throat: Mucous membranes are moist.   Cardiovascular: Normal rate, regular rhythm.  Respiratory: Normal respiratory effort.  No retractions. Genitourinary: deferred Musculoskeletal: No lower extremity tenderness nor edema.   Neurologic:  Normal speech and language. No gross focal neurologic deficits are appreciated.   Skin:  Skin is warm, dry and intact. No rash noted.   ____________________________________________   LABS (all labs ordered are listed, but only abnormal results are displayed)  Labs Reviewed  URINALYSIS, COMPLETE (UACMP) WITH MICROSCOPIC  PREGNANCY, URINE  POC URINE PREG, ED    ____________________________________________  EKG   ____________________________________________  RADIOLOGY  None ____________________________________________   PROCEDURES  Procedure(s) performed: No  Procedures   Critical Care performed: No ____________________________________________   INITIAL IMPRESSION / ASSESSMENT AND PLAN / ED COURSE  Pertinent labs & imaging results that were available during my care of the patient were reviewed by me and considered in my medical decision making (see chart for details).  Patient's medications refilled, POC pregnancy test is negative.  Outpatient follow-up as needed   ____________________________________________   FINAL CLINICAL IMPRESSION(S) / ED DIAGNOSES  Final diagnoses:  Medication refill      NEW MEDICATIONS STARTED DURING THIS VISIT:  Current Discharge Medication List       Note:  This document was prepared using Dragon voice recognition software and may include unintentional dictation errors.   Jene Every, MD 01/18/21 [phone]

## 2021-01-18 NOTE — ED Triage Notes (Signed)
Unique, pt daughter [phone]

## 2021-02-17 ENCOUNTER — Other Ambulatory Visit: Payer: Self-pay

## 2021-02-17 MED ORDER — TRAZODONE HCL 100 MG PO TABS: ORAL_TABLET | ORAL | 1 refills | Status: DC

## 2021-06-04 ENCOUNTER — Other Ambulatory Visit: Payer: Self-pay

## 2021-09-01 ENCOUNTER — Other Ambulatory Visit: Payer: Self-pay | Admitting: Emergency Medicine

## 2021-09-01 ENCOUNTER — Other Ambulatory Visit: Payer: Self-pay

## 2021-09-01 ENCOUNTER — Ambulatory Visit: Payer: Medicaid Other | Admitting: Pharmacy Technician

## 2021-09-01 DIAGNOSIS — Z79899 Other long term (current) drug therapy: Secondary | ICD-10-CM

## 2021-09-01 MED FILL — Haloperidol Tab 2 MG: ORAL | Qty: 30 | Fill #0 | Status: CN

## 2021-09-01 NOTE — Progress Notes (Signed)
Assisted patient with the completion of paperwork.  Patient was accompanied by daughter, Anamari Galeas.  Milani Lowenstein claimed patient as dependent on 2021 Federal Tax Return, and also provides support.  Unique attempted to e-mail paystubs, bill and checking account information to [email or web address].  Did not arrive while patient was in appointment.  Also, provided patient with a checklist of financial information that was still needed before eligibility could be determined.  Also, assisted Unique Criscuolo with obtaining a transcript of her 2021 Federal Income Taxes from the IRS.  [facility] never received financial information from [employer].  Attempted to contact Unique Sorci to make aware.  Unable to reach.  Received a message indicating that the mailbox had not been set-up.  Unable to leave a message.  Patient inquiring about obtaining refills on medications.  [facility] has not filled medications for patient since March of 2022.  Lorie Apley, Pharmacist stated that he did not feel comfortable refilling prescriptions since [facility] has not provided these medications to patient since March of 2022.  Referred patient to [facility] and [employer].  Patient is to contact a provider to schedule an appointment.  Sherilyn Dacosta Care Manager [facility]

## 2021-09-04 ENCOUNTER — Other Ambulatory Visit: Payer: Self-pay

## 2021-09-04 ENCOUNTER — Emergency Department
Admission: EM | Admit: 2021-09-04 | Discharge: 2021-09-04 | Disposition: A | Discharge status: Home / Self Care | Payer: Medicaid Other | Attending: Emergency Medicine | Admitting: Emergency Medicine

## 2021-09-04 DIAGNOSIS — Z760 Encounter for issue of repeat prescription: Secondary | ICD-10-CM | POA: Insufficient documentation

## 2021-09-04 MED ORDER — CYANOCOBALAMIN 1000 MCG PO TABS
1000.0000 ug | ORAL_TABLET | Freq: Every day | ORAL | 1 refills | Status: DC
  Filled 2021-09-04: qty 30, 30d supply, fill #0

## 2021-09-04 MED ORDER — HALOPERIDOL 5 MG PO TABS
ORAL_TABLET | Freq: Every morning | ORAL | 1 refills | Status: DC
Start: 2021-09-04 — End: 2022-01-21
  Filled 2021-09-04: qty 30, 30d supply, fill #0

## 2021-09-04 MED ORDER — TRAZODONE HCL 100 MG PO TABS
ORAL_TABLET | Freq: Every evening | ORAL | 1 refills | Status: DC | PRN
  Filled 2021-09-04: qty 30, 30d supply, fill #0

## 2021-09-04 MED ORDER — ARIPIPRAZOLE 5 MG PO TABS
ORAL_TABLET | Freq: Every day | ORAL | 1 refills | Status: DC
  Filled 2021-09-04: qty 30, 30d supply, fill #0
  Filled 2021-12-07: qty 30, 30d supply, fill #1

## 2021-09-04 MED ORDER — HALOPERIDOL 2 MG PO TABS
ORAL_TABLET | Freq: Every day | ORAL | 1 refills | Status: DC
  Filled 2021-09-04: qty 30, 30d supply, fill #0

## 2021-09-04 MED ORDER — BENZTROPINE MESYLATE 0.5 MG PO TABS
ORAL_TABLET | Freq: Two times a day (BID) | ORAL | 1 refills | Status: DC
  Filled 2021-09-04: qty 60, 30d supply, fill #0
  Filled 2021-12-07: qty 60, 30d supply, fill #1

## 2021-09-04 NOTE — ED Notes (Signed)
Breakfast tray given. °

## 2021-09-04 NOTE — ED Notes (Signed)
Pharmacy tech will reconcile pt's medications with medication management.

## 2021-09-04 NOTE — ED Notes (Signed)
Pt is unsure of the names of the medication she takes.

## 2021-09-04 NOTE — ED Triage Notes (Signed)
Pt states she needs a refill of all her meds (psych), states she ran out about 2-3 days ago.

## 2021-09-04 NOTE — ED Provider Notes (Signed)
[facility] Emergency Department Provider Note  Time seen: 8:38 AM  I have reviewed the triage vital signs and the nursing notes.   HISTORY  Chief Complaint Medication Refill   HPI Annette Garrison is a 45 y.o. female with a past medical history of anemia, bipolar, hypertension, schizophrenia, presents to the emergency department for medication refill.  According to the patient she ran out of her medications approximately 2 days ago, she went to medication management for her refills but was told that she is out of refills and that she needs to see her doctor.  Patient return to the emergency department hoping for medication refills for her psychiatric medications.  Patient denies any medical complaints today.  Largely negative review of systems.   Past Medical History:  Diagnosis Date   Anemia    Bipolar disorder (HCC)    Hypertension    Schizophrenia [facility])     Patient Active Problem List   Diagnosis Date Noted   Schizoaffective disorder, bipolar type (HCC) 10/11/2019   Cannabis abuse    Vitamin B12 deficiency 06/08/2016   Cocaine use disorder, moderate, dependence (HCC) 06/07/2016   Tobacco use disorder 06/07/2016   Anemia 06/07/2016   Cocaine abuse (HCC) 08/04/2015   Dysmenorrhea 08/04/2015   Hypertension 08/04/2015    Past Surgical History:  Procedure Laterality Date   CESAREAN SECTION      Prior to Admission medications   Medication Sig Start Date End Date Taking? Authorizing Provider  ARIPiprazole (ABILIFY) 5 MG tablet TAKE ONE TABLET BY MOUTH EVERY DAY 03/05/20 09/04/21 Yes Erik Obey, MD  benztropine (COGENTIN) 0.5 MG tablet TAKE ONE TABLET BY MOUTH 2 TIMES A DAY 01/18/21 09/04/21 Yes Jene Every, MD  cyanocobalamin 1000 MCG tablet Take 1 tablet (1,000 mcg total) by mouth daily. 01/18/21  Yes Jene Every, MD  haloperidol (HALDOL) 2 MG tablet TAKE ONE TABLET BY MOUTH AT BEDTIME 01/18/21 01/18/22 Yes Jene Every, MD  haloperidol  (HALDOL) 5 MG tablet TAKE ONE TABLET BY MOUTH EVERY MORNING 01/18/21 09/04/21 Yes Jene Every, MD  traZODone (DESYREL) 100 MG tablet TAKE ONE TABLET BY MOUTH AT BEDTIME AS NEEDED FOR SLEEP 01/18/21 01/18/22 Yes Jene Every, MD    No Known Allergies  No family history on file.  Social History Social History   Tobacco Use   Smoking status: Some Days    Packs/day: 0.50    Types: Cigarettes   Smokeless tobacco: Never  Vaping Use   Vaping Use: Never used  Substance Use Topics   Alcohol use: Yes   Drug use: Yes    Types: Marijuana, Cocaine    Comment: no cocaine "in a long time"; last smoked marijuana a couple of days ago    Review of Systems Constitutional: Negative for fever. Cardiovascular: Negative for chest pain. Respiratory: Negative for shortness of breath. Gastrointestinal: Negative for abdominal pain, vomiting  Musculoskeletal: Negative for musculoskeletal complaints Neurological: Negative for headache All other ROS negative  ____________________________________________   PHYSICAL EXAM:  VITAL SIGNS: ED Triage Vitals  Enc Vitals Group     BP 09/04/21 0807 (!) 143/85     Pulse Rate 09/04/21 0807 94     Resp 09/04/21 0807 15     Temp 09/04/21 0807 98.2 F (36.8 C)     Temp Source 09/04/21 0807 Oral     SpO2 09/04/21 0807 99 %     Weight 09/04/21 0809 126 lb 1.6 oz (57.2 kg)     Height 09/04/21 0809 5\' 2"  (  1.575 m)     Head Circumference --      Peak Flow --      Pain Score 09/04/21 0809 0     Pain Loc --      Pain Edu? --      Excl. in GC? --    Constitutional: Alert and oriented. Well appearing and in no distress. Eyes: Normal exam ENT      Head: Normocephalic and atraumatic.      Mouth/Throat: Mucous membranes are moist. Cardiovascular: Normal rate, regular rhythm. No murmurs, rubs, or gallops. Respiratory: Normal respiratory effort without tachypnea nor retractions. Breath sounds are clear Gastrointestinal: Soft and nontender. No  distention. Musculoskeletal: Nontender with normal range of motion in all extremities.  Neurologic:  Normal speech and language. No gross focal neurologic deficits  Skin:  Skin is warm, dry and intact.  Psychiatric: Mood and affect are normal.   INITIAL IMPRESSION / ASSESSMENT AND PLAN / ED COURSE  Pertinent labs & imaging results that were available during my care of the patient were reviewed by me and considered in my medical decision making (see chart for details).   Patient presents emergency department for medication refill.  Patient is not sure which medication she is currently taking but states she was taking all of them until 2 days ago.  We will call medication management to attempt to obtain an up-to-date medication list for the patient.  We will prescribe refills as needed and have the patient follow-up with [facility] for further assistance in medications.  Patient agreeable to plan of care.  Pharmacy has been able to reconcile the patient's medications.  We will discharge with refills.  Annette Garrison was evaluated in Emergency Department on 09/04/2021 for the symptoms described in the history of present illness. She was evaluated in the context of the global COVID-19 pandemic, which necessitated consideration that the patient might be at risk for infection with the SARS-CoV-2 virus that causes COVID-19. Institutional protocols and algorithms that pertain to the evaluation of patients at risk for COVID-19 are in a state of rapid change based on information released by regulatory bodies including the CDC and federal and state organizations. These policies and algorithms were followed during the patient's care in the ED.  ____________________________________________   FINAL CLINICAL IMPRESSION(S) / ED DIAGNOSES  Medication refill   Minna Antis, MD 09/04/21 [phone]

## 2021-10-02 ENCOUNTER — Other Ambulatory Visit: Payer: Medicaid Other

## 2021-11-03 ENCOUNTER — Other Ambulatory Visit: Payer: Self-pay

## 2021-11-04 ENCOUNTER — Telehealth: Payer: Self-pay | Admitting: Pharmacy Technician

## 2021-11-04 NOTE — Telephone Encounter (Signed)
Patient failed to provide 2022 proof of income.  No additional medication assistance will be provided by [facility] without the required proof of income documentation.  Patient notified by letter.  Sherilyn Dacosta Care Manager [facility]    [address], [place]  [address]  November 04, 2021    Sharlon Pfohl [address] [place], [place]  [address]  Dear Dickie La:  This is to inform you that you are no longer eligible to receive medication assistance at [facility].  The reason(s) are:    _____Your total gross monthly household income exceeds 250% of the Federal Poverty Level.   _____Tangible assets (savings, checking, stocks/bonds, pension, retirement, etc.) exceeds our limit  _____You are eligible to receive benefits from [facility], [facility] or HIV Medication              Assistance Program _____You are eligible to receive benefits from a Medicare Part D plan _____You have prescription insurance  _____You are not an [facility] resident __X__Failure to provide all requested documentation.  Still need the following financial documentation from [employer]: Last 30 days paystubs and bank statement, and 2021 Federal Tax Return.    Medication assistance will resume once all requested documentation has been returned to our clinic.  If you have questions, please contact our clinic at [phone].    Thank you,  [facility]

## 2021-12-07 ENCOUNTER — Other Ambulatory Visit: Payer: Self-pay

## 2022-01-13 ENCOUNTER — Other Ambulatory Visit: Payer: Self-pay

## 2022-01-18 ENCOUNTER — Ambulatory Visit: Payer: Medicaid Other | Admitting: Pharmacy Technician

## 2022-01-18 ENCOUNTER — Other Ambulatory Visit: Payer: Self-pay

## 2022-01-18 DIAGNOSIS — Z79899 Other long term (current) drug therapy: Secondary | ICD-10-CM

## 2022-01-18 NOTE — Progress Notes (Signed)
Completed re-certification.Received updated proof of income.  Patient eligible to receive medication assistance at [facility] until time for re-certification in 5277, and as long as eligibility requirements continue to be met. ? ?Jacquelynn Cree ?Care Manager ?[facility]  ?

## 2022-01-21 ENCOUNTER — Encounter: Payer: Self-pay | Admitting: Emergency Medicine

## 2022-01-21 ENCOUNTER — Other Ambulatory Visit: Payer: Self-pay

## 2022-01-21 ENCOUNTER — Emergency Department
Admission: EM | Admit: 2022-01-21 | Discharge: 2022-01-21 | Disposition: A | Discharge status: Home / Self Care | Payer: Medicaid Other | Attending: Emergency Medicine | Admitting: Emergency Medicine

## 2022-01-21 DIAGNOSIS — Z760 Encounter for issue of repeat prescription: Secondary | ICD-10-CM | POA: Insufficient documentation

## 2022-01-21 DIAGNOSIS — I10 Essential (primary) hypertension: Secondary | ICD-10-CM | POA: Insufficient documentation

## 2022-01-21 MED ORDER — HALOPERIDOL 2 MG PO TABS
2.0000 mg | ORAL_TABLET | Freq: Every day | ORAL | 2 refills | Status: DC
  Filled 2022-01-21: qty 30, 30d supply, fill #0
  Filled 2022-03-17: qty 30, 30d supply, fill #1

## 2022-01-21 MED ORDER — HALOPERIDOL 5 MG PO TABS
5.0000 mg | ORAL_TABLET | Freq: Every morning | ORAL | 2 refills | Status: DC
  Filled 2022-01-21: qty 30, 30d supply, fill #0
  Filled 2022-03-17: qty 30, 30d supply, fill #1

## 2022-01-21 MED ORDER — CYANOCOBALAMIN 500 MCG PO TABS
1000.0000 ug | ORAL_TABLET | Freq: Every day | ORAL | 2 refills | Status: DC
  Filled 2022-01-21: qty 20, 10d supply, fill #0
  Filled 2022-03-17: qty 20, 10d supply, fill #1

## 2022-01-21 MED ORDER — ARIPIPRAZOLE 5 MG PO TABS
5.0000 mg | ORAL_TABLET | Freq: Every day | ORAL | 2 refills | Status: DC
  Filled 2022-01-21: qty 30, 30d supply, fill #0
  Filled 2022-03-22: qty 30, 30d supply, fill #1

## 2022-01-21 MED ORDER — BENZTROPINE MESYLATE 0.5 MG PO TABS
0.5000 mg | ORAL_TABLET | Freq: Two times a day (BID) | ORAL | 2 refills | Status: DC
  Filled 2022-01-21: qty 60, 30d supply, fill #0
  Filled 2022-03-17: qty 60, 30d supply, fill #1

## 2022-01-21 MED ORDER — TRAZODONE HCL 100 MG PO TABS
100.0000 mg | ORAL_TABLET | Freq: Every evening | ORAL | 2 refills | Status: DC | PRN
  Filled 2022-01-21: qty 30, 30d supply, fill #0

## 2022-01-21 NOTE — ED Provider Notes (Signed)
? ? ?[facility] ?Emergency Department Provider Note ? ? ? ? Event Date/Time  ? First MD Initiated Contact with Patient 01/21/22 1338   ?  (approximate) ? ? ?History  ? ?Medication Refill ? ? ?HPI ? ?Annette Garrison is a 46 y.o. female with a history of bipolar disorder, schizophrenia, and hypertension presenting to the ED with request for education and refills.  Patient denies any SI or HI.  She presents with her adult daughter who is her primary caregiver at this time.  The daughter admits to not being able to routinely been able to get the patient to get to her appointments.  Patient been seen in the ED multiple times for medication refill.  According to chart review, the patient does have established services available at medication management program.  Patient has not had regular psychology or behavioral follow-up.  Discussed with the daughter the need for the patient have regular and routine behavioral medicine care.  At their request and as a courtesy, we will refill the patient's medicines at this time, but refer her to our H [facility] for ongoing behavioral counseling. ?  ? ? ?Physical Exam  ? ?Triage Vital Signs: ?ED Triage Vitals  ?Enc Vitals Group  ?   BP 01/21/22 1203 (!) 148/109  ?   Pulse Rate 01/21/22 1203 (!) 119  ?   Resp 01/21/22 1203 17  ?   Temp 01/21/22 1203 (!) 97.4 ?F (36.3 ?C)  ?   Temp Source 01/21/22 1203 Oral  ?   SpO2 01/21/22 1203 100 %  ?   Weight 01/21/22 1202 126 lb 1.7 oz (57.2 kg)  ?   Height 01/21/22 1202 5\' 2"  (1.575 m)  ?   Head Circumference --   ?   Peak Flow --   ?   Pain Score 01/21/22 1202 0  ?   Pain Loc --   ?   Pain Edu? --   ?   Excl. in GC? --   ? ? ?Most recent vital signs: ?Vitals:  ? 01/21/22 1326 01/21/22 1538  ?BP: (!) 140/99 138/90  ?Pulse: 100 88  ?Resp: 16 16  ?Temp:    ?SpO2: 100% 100%  ? ? ?General Awake, no distress.  ?CV:  Good peripheral perfusion.  ?RESP:  Normal effort.  ?ABD:  No distention.  ? ? ?ED Results / Procedures / Treatments   ? ?Labs ?(all labs ordered are listed, but only abnormal results are displayed) ?Labs Reviewed - No data to display ? ? ?EKG ? ? ? ?RADIOLOGY ? ? ?No results found. ? ? ?PROCEDURES: ? ?Critical Care performed: No ? ?Procedures ? ? ?MEDICATIONS ORDERED IN ED: ?Medications - No data to display ? ? ?IMPRESSION / MDM / ASSESSMENT AND PLAN / ED COURSE  ?I reviewed the triage vital signs and the nursing notes. ?             ?               ? ?Differential diagnosis includes, but is not limited to, acute psychosis, medication refill ? ?Patient with ED request for refill of her maintenance medications for her bipolar disorder and schizophrenia.  Patient presents in no acute distress and denies any SI or HI.  Patient's adult daughter is present, and has been instructed to establish the patient with a local behavioral counselor for routine medication management.  Patient's home medicines will be refilled as requested, and sent to the medication management program.  Patient is to follow up with [facility] as needed or otherwise directed. Patient is given ED precautions to return to the ED for any worsening or new symptoms. ? ? ?FINAL CLINICAL IMPRESSION(S) / ED DIAGNOSES  ? ?Final diagnoses:  ?Encounter for medication refill  ? ? ? ?Rx / DC Orders  ? ?ED Discharge Orders   ? ?      Ordered  ?  ARIPiprazole (ABILIFY) 5 MG tablet  Daily       ? 01/21/22 1531  ?  benztropine (COGENTIN) 0.5 MG tablet  2 times daily       ? 01/21/22 1531  ?  vitamin B-12 (CYANOCOBALAMIN) 500 MCG tablet  Daily       ? 01/21/22 1531  ?  haloperidol (HALDOL) 2 MG tablet  Daily at bedtime       ? 01/21/22 1531  ?  haloperidol (HALDOL) 5 MG tablet  Every morning       ? 01/21/22 1531  ?  traZODone (DESYREL) 100 MG tablet  At bedtime PRN       ? 01/21/22 1531  ? ?  ?  ? ?  ? ? ? ?Note:  This document was prepared using Dragon voice recognition software and may include unintentional dictation errors. ? ?  ?Lissa Hoard, PA-C ?01/21/22 1950 ? ?   ?Minna Antis, MD ?01/25/22 1430 ? ?

## 2022-01-21 NOTE — Discharge Instructions (Addendum)
Follow-up with [facility] for ongoing counseling and medication management.  They can also help with any referrals in the community for other services. ?

## 2022-01-21 NOTE — ED Triage Notes (Signed)
Pt comes into the ED via POV c/o medication refill on aripiprazole 5mg  and benztropine 0.5mg .  PT in NAD at this time with even and unlabored respirations.  ?

## 2022-01-21 NOTE — ED Notes (Signed)
See triage note  presents with family for medication refill  she was admitted to [facility] and had meds given at that time  Does not have f/u at present ?

## 2022-01-22 ENCOUNTER — Other Ambulatory Visit: Payer: Self-pay

## 2022-03-17 ENCOUNTER — Other Ambulatory Visit: Payer: Self-pay

## 2022-03-19 ENCOUNTER — Other Ambulatory Visit: Payer: Self-pay

## 2022-03-22 ENCOUNTER — Other Ambulatory Visit: Payer: Self-pay

## 2022-09-17 ENCOUNTER — Emergency Department
Admission: EM | Admit: 2022-09-17 | Discharge: 2022-09-17 | Disposition: A | Discharge status: Home / Self Care | Payer: Medicaid Other | Attending: Emergency Medicine | Admitting: Emergency Medicine

## 2022-09-17 ENCOUNTER — Encounter: Payer: Self-pay | Admitting: Emergency Medicine

## 2022-09-17 ENCOUNTER — Other Ambulatory Visit: Payer: Self-pay

## 2022-09-17 DIAGNOSIS — I10 Essential (primary) hypertension: Secondary | ICD-10-CM | POA: Insufficient documentation

## 2022-09-17 DIAGNOSIS — F259 Schizoaffective disorder, unspecified: Secondary | ICD-10-CM | POA: Insufficient documentation

## 2022-09-17 DIAGNOSIS — Z760 Encounter for issue of repeat prescription: Secondary | ICD-10-CM | POA: Insufficient documentation

## 2022-09-17 MED ORDER — TRAZODONE HCL 100 MG PO TABS
100.0000 mg | ORAL_TABLET | Freq: Every evening | ORAL | 2 refills | Status: AC | PRN
  Filled 2022-09-17: qty 30, 30d supply, fill #0
  Filled 2023-04-06 (×2): qty 30, 30d supply, fill #1

## 2022-09-17 MED ORDER — CYANOCOBALAMIN 500 MCG PO TABS
1000.0000 ug | ORAL_TABLET | Freq: Every day | ORAL | 2 refills | Status: AC
Start: 2022-09-17 — End: ?
  Filled 2022-09-17: qty 60, 30d supply, fill #0

## 2022-09-17 MED ORDER — HALOPERIDOL 5 MG PO TABS
5.0000 mg | ORAL_TABLET | Freq: Every morning | ORAL | 2 refills | Status: DC
  Filled 2022-09-17: qty 30, 30d supply, fill #0
  Filled 2023-04-06 (×2): qty 30, 30d supply, fill #1

## 2022-09-17 MED ORDER — HALOPERIDOL 2 MG PO TABS
2.0000 mg | ORAL_TABLET | Freq: Every day | ORAL | 2 refills | Status: AC
  Filled 2022-09-17: qty 30, 30d supply, fill #0
  Filled 2023-04-06 (×2): qty 30, 30d supply, fill #1

## 2022-09-17 MED ORDER — BENZTROPINE MESYLATE 0.5 MG PO TABS
0.5000 mg | ORAL_TABLET | Freq: Two times a day (BID) | ORAL | 2 refills | Status: DC
  Filled 2022-09-17: qty 60, 30d supply, fill #0
  Filled 2023-04-06 (×2): qty 60, 30d supply, fill #1

## 2022-09-17 MED ORDER — ARIPIPRAZOLE 5 MG PO TABS
5.0000 mg | ORAL_TABLET | Freq: Every day | ORAL | 2 refills | Status: AC
  Filled 2022-09-17: qty 30, 30d supply, fill #0
  Filled 2023-06-14: qty 30, 30d supply, fill #1

## 2022-09-17 NOTE — ED Provider Triage Note (Signed)
Emergency Medicine Provider Triage Evaluation Note  Annette Garrison , a 46 y.o. female  was evaluated in triage.  Pt complains of need for medication refill. Comes to ED for all medication.   Review of Systems  Positive: No complaints Negative: None  Physical Exam  BP (!) 142/112 (BP Location: Left Arm)   Pulse 85   Temp 98.6 F (37 C) (Oral)   Resp 16   SpO2 100%  Gen:   Awake, no distress   Resp:  Normal effort  MSK:   Moves extremities without difficulty  Other:    Medical Decision Making  Medically screening exam initiated at 12:53 PM.  Appropriate orders placed.  [place] was informed that the remainder of the evaluation will be completed by another provider, this initial triage assessment does not replace that evaluation, and the importance of remaining in the ED until their evaluation is complete.     Johnn Hai, PA-C 09/17/22 1255

## 2022-09-17 NOTE — ED Notes (Signed)
E-signature not working at this time. Pt verbalized understanding of D/C instructions, prescriptions and follow up care with no further questions at this time. Pt in NAD and ambulatory at time of D/C.  

## 2022-09-17 NOTE — ED Provider Notes (Signed)
[facility] Provider Note    Event Date/Time   First MD Initiated Contact with Patient 09/17/22 1257     (approximate)   History   Chief Complaint Medication Refill   HPI  Annette Garrison is a 46 y.o. female with past medical history of hypertension, anemia, schizoaffective disorder, and cocaine abuse who presents to the ED requesting medication refill.  Patient reports that she has been out of her psychiatric medications for about the past week, has been feeling increasingly anxious and is requesting a refill of these medications.  She denies any suicidal or homicidal ideation, has not had any auditory or visual hallucinations.  She currently denies any medical complaints.  She previously went to the [facility] clinic but has not been able to go there in over a year, does not currently have a psychiatrist.     Physical Exam   Triage Vital Signs: ED Triage Vitals  Enc Vitals Group     BP 09/17/22 1249 (!) 142/112     Pulse Rate 09/17/22 1249 85     Resp 09/17/22 1249 16     Temp 09/17/22 1249 98.6 F (37 C)     Temp Source 09/17/22 1249 Oral     SpO2 09/17/22 1249 100 %     Weight 09/17/22 1253 130 lb (59 kg)     Height 09/17/22 1253 5\' 3"  (1.6 m)     Head Circumference --      Peak Flow --      Pain Score 09/17/22 1253 0     Pain Loc --      Pain Edu? --      Excl. in [place]? --     Most recent vital signs: Vitals:   09/17/22 1249  BP: (!) 142/112  Pulse: 85  Resp: 16  Temp: 98.6 F (37 C)  SpO2: 100%    Constitutional: Alert and oriented. Eyes: Conjunctivae are normal. Head: Atraumatic. Nose: No congestion/rhinnorhea. Mouth/Throat: Mucous membranes are moist.  Cardiovascular: Normal rate, regular rhythm. Grossly normal heart sounds.  2+ radial pulses bilaterally. Respiratory: Normal respiratory effort.  No retractions. Lungs CTAB. Gastrointestinal: Soft and nontender. No distention. Musculoskeletal: No lower extremity  tenderness nor edema.  Neurologic:  Normal speech and language. No gross focal neurologic deficits are appreciated.    ED Results / Procedures / Treatments   Labs (all labs ordered are listed, but only abnormal results are displayed) Labs Reviewed - No data to display   PROCEDURES:  Critical Care performed: No  Procedures   MEDICATIONS ORDERED IN ED: Medications - No data to display   IMPRESSION / MDM / [place] / ED COURSE  I reviewed the triage vital signs and the nursing notes.                              46 y.o. female with past medical history of hypertension, anemia, schizoaffective disorder, and cocaine use who presents to the ED requesting refill of her psychiatric medications after she ran out 1 week ago.  Patient's presentation is most consistent with acute, uncomplicated illness.  Differential diagnosis includes, but is not limited to, psychosis, suicidal ideation, encounter for medication refill.  Patient well-appearing and in no acute distress, vital signs remarkable for elevated blood pressure but otherwise reassuring.  Patient denies any medical complaints beyond anxiety, does not represent a threat to herself or others and no indication for psychiatric  admission at this time.  I we will refill patient's medications and emphasized the need for her to reestablish care with PCP and to establish care with psychiatry.  She was counseled to return to the ED for new or worsening symptoms, patient agrees with plan.      FINAL CLINICAL IMPRESSION(S) / ED DIAGNOSES   Final diagnoses:  Encounter for medication refill  Schizoaffective disorder, unspecified type ([place])     Rx / DC Orders   ED Discharge Orders          Ordered    ARIPiprazole (ABILIFY) 5 MG tablet  Daily        09/17/22 1321    benztropine (COGENTIN) 0.5 MG tablet  2 times daily        09/17/22 1321    haloperidol (HALDOL) 2 MG tablet  Daily at bedtime        09/17/22 1321     haloperidol (HALDOL) 5 MG tablet  Every morning        09/17/22 1321    traZODone (DESYREL) 100 MG tablet  At bedtime PRN        09/17/22 1321    cyanocobalamin (VITAMIN B12) 500 MCG tablet  Daily        09/17/22 1321             Note:  This document was prepared using Dragon voice recognition software and may include unintentional dictation errors.   Blake Divine, MD 09/17/22 1324

## 2022-09-17 NOTE — ED Triage Notes (Signed)
Patient to ED for medication refill. Patient states that she is unsure of medication names but that it is her psych medications that she last received from here.

## 2022-09-17 NOTE — ED Notes (Signed)
Pt here for psych medication refill. Unable to establish care with psych provider per patient report.

## 2022-10-05 ENCOUNTER — Other Ambulatory Visit: Payer: Self-pay

## 2023-02-04 ENCOUNTER — Other Ambulatory Visit: Payer: Self-pay

## 2023-02-04 ENCOUNTER — Emergency Department
Admission: EM | Admit: 2023-02-04 | Discharge: 2023-02-04 | Disposition: A | Discharge status: Home / Self Care | Payer: Medicaid Other | Attending: Emergency Medicine | Admitting: Emergency Medicine

## 2023-02-04 DIAGNOSIS — K0889 Other specified disorders of teeth and supporting structures: Secondary | ICD-10-CM | POA: Diagnosis present

## 2023-02-04 MED ORDER — AMOXICILLIN 875 MG PO TABS
875.0000 mg | ORAL_TABLET | Freq: Two times a day (BID) | ORAL | 0 refills | Status: DC
  Filled 2023-02-04: qty 20, 10d supply, fill #0

## 2023-02-04 MED ORDER — NAPROXEN 500 MG PO TABS
500.0000 mg | ORAL_TABLET | Freq: Two times a day (BID) | ORAL | 0 refills | Status: AC
  Filled 2023-02-04: qty 12, 6d supply, fill #0

## 2023-02-04 NOTE — Discharge Instructions (Signed)
OPTIONS FOR DENTAL FOLLOW UP CARE ° °[place] Department of Health and Human Services - Local Safety Net Dental Clinics °http://www.ncdhhs.gov/dph/oralhealth/services/safetynetclinics.htm °  °[facility] (336-562-3123) ° °[facility] [place] ([phone]) ° °[facility] ([phone] ext [phone]) ° °[facility] ([phone]) ° °[facility] ([phone]) °This clinic caters to the indigent population and is on a lottery system. °Location: °[facility], [address], [place] °Clinic Hours: °Wednesdays from 6pm - 9pm, patients seen by a lottery system. °For dates, call or go to www.med.[facility].edu/[facility]/patients/Dental-[facility] °Services: °Cleanings, fillings and simple extractions. °Payment Options: °DENTAL WORK IS FREE OF CHARGE. Bring proof of income or support. °Best way to get seen: °Arrive at 5:15 pm - this is a lottery, NOT first come/first serve, so arriving earlier will not increase your chances of being seen. °  °  °[facility] °[phone] °Select option 1 for emergencies °  °Location: °[facility], [address], [place] °Clinic Hours: °No walk-ins accepted - call the day before to schedule an appointment. °Check in times are 9:30 am and 1:30 pm. °Services: °Simple extractions, temporary fillings, pulpectomy/pulp debridement, uncomplicated abscess drainage. °Payment Options: °PAYMENT IS DUE AT THE TIME OF SERVICE.  Fee is usually $100-200, additional surgical procedures (e.g. abscess drainage) may be extra. °Cash, checks, Visa/MasterCard accepted.  Can file Medicaid if patient is covered for dental - patient should call case worker to check. °No discount for [facility] patients. °Best way to get seen: °MUST call the day before and get onto the schedule. Can usually be seen the next 1-2 days. No walk-ins accepted. °  °  °[facility] °[phone] °   °Location: °[facility], [address], [place] °Clinic Hours: °M, W, Th, F 8am or 1:30pm, Tues 9a or 1:30 - first come/first served. °Services: °Simple extractions, temporary fillings, uncomplicated abscess drainage.  You do not need to be an [place] resident. °Payment Options: °PAYMENT IS DUE AT THE TIME OF SERVICE. °Dental insurance, otherwise sliding scale - bring proof of income or support. °Depending on income and treatment needed, cost is usually $50-200. °Best way to get seen: °Arrive early as it is first come/first served. °  °  °[facility] °[phone] °  °Location: °[address] °Clinic Hours: °Mon-Thu 8a-5p °Services: °Most basic dental services including extractions and fillings. °Payment Options: °PAYMENT IS DUE AT THE TIME OF SERVICE. °Sliding scale, up to 50% off - bring proof if income or support. °Medicaid with dental option accepted. °Best way to get seen: °Call to schedule an appointment, can usually be seen within 2 weeks OR they will try to see walk-ins - show up at 8a or 2p (you may have to wait). °  °  °[facility] °919-245-2435 °[place] RESIDENTS ONLY °  °Location: °[facility], [address], [place],  [address] °Clinic Hours: By appointment only. °Monday - Thursday 8am-5pm, Friday 8am-12pm °Services: Cleanings, fillings, extractions. °Payment Options: °PAYMENT IS DUE AT THE TIME OF SERVICE. °Cash, Visa or MasterCard. Sliding scale - $30 minimum per service. °Best way to get seen: °Come in to office, complete packet and make an appointment - need proof of income °or support monies for each household member and proof of [place] residence. °Usually takes about a month to get in. °  °  °[facility] °[phone] °  °Location: °[address]   [place] °Clinic Hours: Walk-in Urgent Care Dental Services are offered Monday-Friday  mornings only. °The numbers of emergencies accepted daily is limited to the number of °providers available. °Maximum 15 - Mondays, Wednesdays & Thursdays °Maximum 10 - Tuesdays & Fridays °Services: °You do not need to be a [place] resident to be seen for a dental emergency. °Emergencies are defined as pain, swelling, abnormal bleeding, or dental trauma. Walkins will receive x-rays if needed. °NOTE: Dental cleaning is not an emergency. °Payment Options: °PAYMENT IS DUE AT THE TIME OF SERVICE. °Minimum co-pay is $40.00 for uninsured patients. °Minimum co-pay is $3.00 for Medicaid with dental coverage. °Dental Insurance is accepted and must be presented at time of visit. °Medicare does not cover dental. °Forms of payment: Cash, credit card, checks. °Best way to get seen: °If not previously registered with the clinic, walk-in dental registration begins at 7:15 am and is on a first come/first serve basis. °If previously registered with the clinic, call to make an appointment. °  °  °[facility] °[phone] °[place] RESIDENTS ONLY °  °Location: °[address], [place], [place] °Clinic Hours: °Mon-Thu 10a-2p °Services: Extractions only! °Payment Options: °FREE (donations accepted) - bring proof of income or support °Best way to get seen: °Call and schedule an appointment OR come at 8am on the 1st Monday of every month (except for holidays) when it is first come/first served. °  °  °[facility] °[phone] °  °Location: °[address], [place] °Clinic Hours: °Friday mornings °Services, Payment Options, Best way to get seen: °Call for info °

## 2023-02-04 NOTE — ED Provider Notes (Signed)
[facility] Provider Note    Event Date/Time   First MD Initiated Contact with Patient 02/04/23 [phone]     (approximate)   History   Dental Pain   HPI  Annette Garrison is a 47 y.o. female with history of schizophrenia presents emergency department for right lower dental pain.  States tooth is becoming loose and just hanging.  No regular dentist.  Some pain.  No chest pain or shortness of breath      Physical Exam   Triage Vital Signs: ED Triage Vitals  Enc Vitals Group     BP 02/04/23 0814 (!) 158/109     Pulse Rate 02/04/23 0814 99     Resp 02/04/23 0814 18     Temp 02/04/23 0814 97.6 F (36.4 C)     Temp src --      SpO2 02/04/23 0814 97 %     Weight --      Height --      Head Circumference --      Peak Flow --      Pain Score 02/04/23 0816 8     Pain Loc --      Pain Edu? --      Excl. in [place]? --     Most recent vital signs: Vitals:   02/04/23 0814  BP: (!) 158/109  Pulse: 99  Resp: 18  Temp: 97.6 F (36.4 C)  SpO2: 97%     General: Awake, no distress.   CV:  Good peripheral perfusion. regular rate and  rhythm Resp:  Normal effort. Lungs cta Abd:  No distention.   Other:  Right lower jaw is tender to palpation, lower incisor appears to be loose   ED Results / Procedures / Treatments   Labs (all labs ordered are listed, but only abnormal results are displayed) Labs Reviewed - No data to display   EKG     RADIOLOGY     PROCEDURES:   Procedures   MEDICATIONS ORDERED IN ED: Medications - No data to display   IMPRESSION / MDM / [place] / ED COURSE  I reviewed the triage vital signs and the nursing notes.                              Differential diagnosis includes, but is not limited to, dental pain, dental abscess, fractured tooth  Patient's presentation is most consistent with acute, uncomplicated illness.   I did explain findings to the patient.  Explained to her we do not have a  dentist here and we cannot pull teeth.  Gave her a list of dental clinics that perform on a sliding scale.  She was given prescription for amoxicillin and Naprosyn.  Follow-up with her regular doctor if not improving to 3 days.  She is in agreement treatment plan.  Discharged stable condition      FINAL CLINICAL IMPRESSION(S) / ED DIAGNOSES   Final diagnoses:  Pain, dental     Rx / DC Orders   ED Discharge Orders          Ordered    amoxicillin (AMOXIL) 875 MG tablet  2 times daily        02/04/23 0843    naproxen (NAPROSYN) 500 MG tablet  2 times daily with meals        02/04/23 0843             Note:  This document was prepared using Dragon voice recognition software and may include unintentional dictation errors.    Versie Starks, PA-C 02/04/23 [id number]    Blake Divine, MD 02/06/23 [phone]

## 2023-02-04 NOTE — ED Triage Notes (Signed)
Pt to ED via POV from home. Pt reports right lower dental pain that has been ongoing for awhile. Pt reports pain has gotten worse and doesn't have insurance to see a Pharmacist, community.

## 2023-03-18 ENCOUNTER — Other Ambulatory Visit: Payer: Self-pay

## 2023-04-06 ENCOUNTER — Other Ambulatory Visit: Payer: Self-pay

## 2023-04-06 ENCOUNTER — Other Ambulatory Visit (HOSPITAL_COMMUNITY): Payer: Self-pay

## 2023-04-06 ENCOUNTER — Ambulatory Visit: Payer: Self-pay

## 2023-04-06 NOTE — Telephone Encounter (Signed)
Second attempt to call- no answer.

## 2023-04-06 NOTE — Telephone Encounter (Signed)
Agent was about to transfer call and pt hung up.  Called pt number listed in chart- person answered and stated wrong number. NT checked the number dialed and matches what is in chart.   Per agent pt reported a Diastolic BP >100. Wanted appt.  Reached out to [employer] who is trying to find the call and get number.  Misty Stanley got a phone number of [phone]. Called and no answer.

## 2023-04-06 NOTE — Telephone Encounter (Signed)
Advised by Misty Stanley, Team Leader, to go ahead and close out chart.

## 2023-04-12 ENCOUNTER — Other Ambulatory Visit: Payer: Self-pay

## 2023-04-12 ENCOUNTER — Emergency Department
Admission: EM | Admit: 2023-04-12 | Discharge: 2023-04-12 | Disposition: A | Discharge status: Home / Self Care | Payer: Medicaid Other | Attending: Emergency Medicine | Admitting: Emergency Medicine

## 2023-04-12 DIAGNOSIS — F17210 Nicotine dependence, cigarettes, uncomplicated: Secondary | ICD-10-CM | POA: Insufficient documentation

## 2023-04-12 DIAGNOSIS — I10 Essential (primary) hypertension: Secondary | ICD-10-CM | POA: Insufficient documentation

## 2023-04-12 DIAGNOSIS — R42 Dizziness and giddiness: Secondary | ICD-10-CM | POA: Diagnosis present

## 2023-04-12 LAB — BASIC METABOLIC PANEL
Anion gap: 8 (ref 5–15)
BUN: 16 mg/dL (ref 6–20)
CO2: 25 mmol/L (ref 22–32)
Calcium: 9.1 mg/dL (ref 8.9–10.3)
Chloride: 104 mmol/L (ref 98–111)
Creatinine, Ser: 1.04 mg/dL — ABNORMAL HIGH (ref 0.44–1.00)
GFR, Estimated: >60 mL/min (ref >60)
Glucose, Bld: 104 mg/dL — ABNORMAL HIGH (ref 70–99)
Potassium: 4.1 mmol/L (ref 3.5–5.1)
Sodium: 137 mmol/L (ref 135–145)

## 2023-04-12 LAB — CBC
HCT: 40.1 % (ref 36.0–46.0)
Hemoglobin: 12 g/dL (ref 12.0–15.0)
MCH: 22.6 pg — ABNORMAL LOW (ref 26.0–34.0)
MCHC: 29.9 g/dL — ABNORMAL LOW (ref 30.0–36.0)
MCV: 75.7 fL — ABNORMAL LOW (ref 80.0–100.0)
Platelets: 259 10*3/uL (ref 150–400)
RBC: 5.3 MIL/uL — ABNORMAL HIGH (ref 3.87–5.11)
RDW: 17.6 % — ABNORMAL HIGH (ref 11.5–15.5)
WBC: 5.4 10*3/uL (ref 4.0–10.5)
nRBC: 0 % (ref 0.0–0.2)

## 2023-04-12 MED ORDER — AMLODIPINE BESYLATE 5 MG PO TABS
5.0000 mg | ORAL_TABLET | Freq: Every day | ORAL | 11 refills | Status: DC
  Filled 2023-04-12: qty 30, 30d supply, fill #0
  Filled 2023-06-14: qty 30, 30d supply, fill #1

## 2023-04-12 NOTE — ED Triage Notes (Signed)
Pt to ED from dentist for HTN and dizziness. Hx of htn, does not have doctor that prescribed meds. +h/a . NAD noted.

## 2023-04-12 NOTE — ED Provider Notes (Signed)
[facility] Provider Note    Event Date/Time   First MD Initiated Contact with Patient 04/12/23 1343     (approximate)   History   Hypertension and Dizziness   HPI  Annette Garrison is a 47 y.o. female who was about to have some dental work when dental team told her that her blood pressure was too high to have anything done and reported that she needed to talk to her primary care doctor.  She does not have a primary care doctor so came to the hospital.  Overall she feels quite well.  She does smoke cigarettes.  She does admit to occasional cocaine use as well but none recently     Physical Exam   Triage Vital Signs: ED Triage Vitals  Enc Vitals Group     BP 04/12/23 1245 (!) 146/130     Pulse Rate 04/12/23 1245 (!) 101     Resp 04/12/23 1245 18     Temp 04/12/23 1245 98.7 F (37.1 C)     Temp Source 04/12/23 1245 Oral     SpO2 04/12/23 1245 95 %     Weight 04/12/23 1245 59 kg (130 lb)     Height 04/12/23 1245 1.6 m (5\' 3" )     Head Circumference --      Peak Flow --      Pain Score 04/12/23 1250 4     Pain Loc --      Pain Edu? --      Excl. in [place]? --     Most recent vital signs: Vitals:   04/12/23 1245 04/12/23 1401  BP: (!) 146/130 (!) 156/97  Pulse: (!) 101 73  Resp: 18 18  Temp: 98.7 F (37.1 C)   SpO2: 95% 98%     General: Awake, no distress.  CV:  Good peripheral perfusion.  Resp:  Normal effort.  Abd:  No distention.  Other:     ED Results / Procedures / Treatments   Labs (all labs ordered are listed, but only abnormal results are displayed) Labs Reviewed  CBC - Abnormal; Notable for the following components:      Result Value   RBC 5.30 (*)    MCV 75.7 (*)    MCH 22.6 (*)    MCHC 29.9 (*)    RDW 17.6 (*)    All other components within normal limits  BASIC METABOLIC PANEL - Abnormal; Notable for the following components:   Glucose, Bld 104 (*)    Creatinine, Ser 1.04 (*)    All other components within  normal limits     EKG  ED ECG REPORT I, Jene Every, the attending physician, personally viewed and interpreted this ECG.  Date: 04/12/2023  Rhythm: normal sinus rhythm QRS Axis: normal Intervals: normal ST/T Wave abnormalities: normal Narrative Interpretation: no evidence of acute ischemia    RADIOLOGY     PROCEDURES:  Critical Care performed:   Procedures   MEDICATIONS ORDERED IN ED: Medications - No data to display   IMPRESSION / MDM / ASSESSMENT AND PLAN / ED COURSE  I reviewed the triage vital signs and the nursing notes. Patient's presentation is most consistent with exacerbation of chronic illness.  Patient presents with elevated blood pressure, blood pressure here is elevated however the patient is asymptomatic.  Lab work reviewed and is unremarkable.  EKG is unremarkable.  Prescribed amlodipine and have referred the patient to primary care provider  FINAL CLINICAL IMPRESSION(S) / ED DIAGNOSES   Final diagnoses:  Primary hypertension  Uncontrolled hypertension     Rx / DC Orders   ED Discharge Orders          Ordered    Ambulatory Referral to Primary Care (Establish Care)        04/12/23 1353    amLODipine (NORVASC) 5 MG tablet  Daily        04/12/23 1353             Note:  This document was prepared using Dragon voice recognition software and may include unintentional dictation errors.   Jene Every, MD 04/12/23 [phone]

## 2023-04-18 ENCOUNTER — Other Ambulatory Visit (HOSPITAL_BASED_OUTPATIENT_CLINIC_OR_DEPARTMENT_OTHER): Payer: Self-pay

## 2023-05-13 ENCOUNTER — Other Ambulatory Visit: Payer: Self-pay

## 2023-05-13 MED ORDER — IBUPROFEN 600 MG PO TABS
600.0000 mg | ORAL_TABLET | Freq: Three times a day (TID) | ORAL | 0 refills | Status: DC | PRN
Start: 2023-05-13 — End: 2023-08-22
  Filled 2023-05-13: qty 21, 7d supply, fill #0

## 2023-06-14 ENCOUNTER — Other Ambulatory Visit: Payer: Self-pay

## 2023-06-15 ENCOUNTER — Other Ambulatory Visit: Payer: Self-pay

## 2023-06-24 ENCOUNTER — Other Ambulatory Visit: Payer: Self-pay

## 2023-06-30 ENCOUNTER — Other Ambulatory Visit: Payer: Self-pay

## 2023-07-07 ENCOUNTER — Other Ambulatory Visit: Payer: Self-pay

## 2023-08-04 ENCOUNTER — Other Ambulatory Visit: Payer: Self-pay

## 2023-08-22 ENCOUNTER — Emergency Department
Admission: EM | Admit: 2023-08-22 | Discharge: 2023-08-22 | Disposition: A | Discharge status: Home / Self Care | Payer: MEDICAID | Attending: Emergency Medicine | Admitting: Emergency Medicine

## 2023-08-22 ENCOUNTER — Other Ambulatory Visit: Payer: Self-pay

## 2023-08-22 DIAGNOSIS — Z760 Encounter for issue of repeat prescription: Secondary | ICD-10-CM | POA: Insufficient documentation

## 2023-08-22 DIAGNOSIS — I10 Essential (primary) hypertension: Secondary | ICD-10-CM | POA: Insufficient documentation

## 2023-08-22 MED ORDER — HALOPERIDOL 5 MG PO TABS
5.0000 mg | ORAL_TABLET | Freq: Every morning | ORAL | 2 refills | Status: AC
  Filled 2023-08-22: qty 30, 30d supply, fill #0
  Filled 2024-04-02: qty 30, 30d supply, fill #1
  Filled 2024-08-01: qty 30, 30d supply, fill #2

## 2023-08-22 MED ORDER — BENZTROPINE MESYLATE 0.5 MG PO TABS
0.5000 mg | ORAL_TABLET | Freq: Two times a day (BID) | ORAL | 2 refills | Status: AC
  Filled 2023-08-22: qty 60, 30d supply, fill #0
  Filled 2024-04-02: qty 60, 30d supply, fill #1
  Filled 2024-08-01: qty 60, 30d supply, fill #2

## 2023-08-22 MED ORDER — AMLODIPINE BESYLATE 5 MG PO TABS
5.0000 mg | ORAL_TABLET | Freq: Every day | ORAL | 2 refills | Status: AC
  Filled 2023-08-22: qty 30, 30d supply, fill #0
  Filled 2024-04-02: qty 30, 30d supply, fill #1
  Filled 2024-08-01: qty 30, 30d supply, fill #2

## 2023-08-22 NOTE — ED Triage Notes (Signed)
Pt to ED for needing refill on night time meds. Reports does not know what the names of the meds are.

## 2023-08-22 NOTE — ED Provider Notes (Signed)
[facility] Provider Note    Event Date/Time   First MD Initiated Contact with Patient 08/22/23 1427     (approximate)   History   No chief complaint on file.   HPI  Annette Garrison is a 47 y.o. female presents to the ED for refills of her medication.  Patient states that she does not know the name of her prescriptions but states that they are medicines that she takes during the day.  In looking through her chart it appears that she takes medications that have been prescribed by ED doctors in the past.  Patient has a history of hypertension, schizophrenia, bipolar, polysubstance abuse.     Physical Exam   Triage Vital Signs: ED Triage Vitals [08/22/23 1304]  Encounter Vitals Group     BP 139/87     Systolic BP Percentile      Diastolic BP Percentile      Pulse Rate 98     Resp 18     Temp 98 F (36.7 C)     Temp src      SpO2 100 %     Weight 150 lb (68 kg)     Height 5\' 3"  (1.6 m)     Head Circumference      Peak Flow      Pain Score 0     Pain Loc      Pain Education      Exclude from Growth Chart     Most recent vital signs: Vitals:   08/22/23 1304  BP: 139/87  Pulse: 98  Resp: 18  Temp: 98 F (36.7 C)  SpO2: 100%     General: Awake, no distress.  CV:  Good peripheral perfusion.  Resp:  Normal effort.  Abd:  No distention.  Other:     ED Results / Procedures / Treatments   Labs (all labs ordered are listed, but only abnormal results are displayed) Labs Reviewed - No data to display    PROCEDURES:  Critical Care performed:   Procedures   MEDICATIONS ORDERED IN ED: Medications - No data to display   IMPRESSION / MDM / ASSESSMENT AND PLAN / ED COURSE  I reviewed the triage vital signs and the nursing notes.   Differential diagnosis includes, but is not limited to, medication refill.  47 year old female presents to the ED for refill of her medication.  I called the pharmacist at [facility] and refilled  medications that patient needed.  Also stressed the need for her to follow-up with her psychiatrist/mental health person or PCP and pharmacist will do the same when she picks up her medication.      Patient's presentation is most consistent with acute, uncomplicated illness.  FINAL CLINICAL IMPRESSION(S) / ED DIAGNOSES   Final diagnoses:  Encounter for medication refill     Rx / DC Orders   ED Discharge Orders          Ordered    haloperidol (HALDOL) 5 MG tablet  Every morning        08/22/23 1439    benztropine (COGENTIN) 0.5 MG tablet  2 times daily        08/22/23 1439    amLODipine (NORVASC) 5 MG tablet  Daily        08/22/23 1439             Note:  This document was prepared using Dragon voice recognition software and may include unintentional dictation errors.   Levada Schilling,  Kallie Locks, PA-C 08/22/23 1516    Merwyn Katos, MD 08/23/23 Rickey Primus

## 2023-08-22 NOTE — Discharge Instructions (Signed)
Call your psychiatric doctor for an appointment and also for continued refills on your medications.

## 2024-04-02 ENCOUNTER — Other Ambulatory Visit: Payer: Self-pay

## 2024-04-04 ENCOUNTER — Ambulatory Visit: Payer: MEDICAID

## 2024-04-10 ENCOUNTER — Encounter (LOCAL_COMMUNITY_HEALTH_CENTER): Payer: MEDICAID | Admitting: Nurse Practitioner

## 2024-04-10 ENCOUNTER — Ambulatory Visit: Payer: MEDICAID

## 2024-04-10 ENCOUNTER — Ambulatory Visit: Payer: MEDICAID | Admitting: Nurse Practitioner

## 2024-04-10 DIAGNOSIS — Z113 Encounter for screening for infections with a predominantly sexual mode of transmission: Secondary | ICD-10-CM

## 2024-04-10 DIAGNOSIS — Z9851 Tubal ligation status: Secondary | ICD-10-CM | POA: Insufficient documentation

## 2024-04-10 LAB — WET PREP FOR TRICH, YEAST, CLUE
Clue Cell Exam: NEGATIVE
Trichomonas Exam: NEGATIVE
Yeast Exam: NEGATIVE
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
## 2024-04-10 NOTE — Progress Notes (Signed)
 During patient-nurse interview, it was discovered patient has BTL. Due to policy and regulations of the health department, patient cannot be seen for PE in [facility] clinic. Patient appointment changed to STI only visit. Please see separate encounter on this same date for details of that visit.  Leary Provencal L. Vicky Mccanless, FNP-C

## 2024-04-10 NOTE — Progress Notes (Signed)
 Here for Pe and STD and has BTL  .Rosiland Cooks, RN

## 2024-04-10 NOTE — Progress Notes (Signed)
Wet prep reviewed, no treatment indicated..Aldrin Engelhard Brewer-Jensen, RN  

## 2024-04-11 ENCOUNTER — Ambulatory Visit: Payer: Self-pay

## 2024-04-15 LAB — GONOCOCCUS CULTURE

## 2024-08-01 ENCOUNTER — Other Ambulatory Visit: Payer: Self-pay

## 2024-08-13 ENCOUNTER — Other Ambulatory Visit: Payer: Self-pay
